# Patient Record
Sex: Female | Born: 1950 | Race: Black or African American | Hispanic: No | Smoking: Never smoker
Health system: Southern US, Community
[De-identification: ages and names within clinical notes are randomized; demographics above are authoritative.]

## PROBLEM LIST (undated history)

## (undated) DIAGNOSIS — E785 Hyperlipidemia, unspecified: Secondary | ICD-10-CM

## (undated) DIAGNOSIS — I10 Essential (primary) hypertension: Secondary | ICD-10-CM

## (undated) DIAGNOSIS — E669 Obesity, unspecified: Secondary | ICD-10-CM

## (undated) DIAGNOSIS — M1990 Unspecified osteoarthritis, unspecified site: Secondary | ICD-10-CM

## (undated) HISTORY — PX: ABDOMINAL HYSTERECTOMY: SHX81

---

## 2003-06-22 ENCOUNTER — Other Ambulatory Visit: Payer: Self-pay

## 2004-01-14 ENCOUNTER — Emergency Department (HOSPITAL_COMMUNITY): Admission: EM | Admit: 2004-01-14 | Discharge: 2004-01-14 | Payer: Self-pay | Admitting: Emergency Medicine

## 2010-10-21 ENCOUNTER — Ambulatory Visit: Payer: Self-pay | Admitting: Emergency Medicine

## 2012-02-08 ENCOUNTER — Emergency Department (HOSPITAL_COMMUNITY)
Admission: EM | Admit: 2012-02-08 | Discharge: 2012-02-08 | Disposition: A | Discharge status: Home / Self Care | Payer: Self-pay | Attending: Emergency Medicine | Admitting: Emergency Medicine

## 2012-02-08 ENCOUNTER — Encounter (HOSPITAL_COMMUNITY): Payer: Self-pay

## 2012-02-08 ENCOUNTER — Emergency Department (HOSPITAL_COMMUNITY): Payer: Self-pay

## 2012-02-08 DIAGNOSIS — R509 Fever, unspecified: Secondary | ICD-10-CM | POA: Insufficient documentation

## 2012-02-08 DIAGNOSIS — B9789 Other viral agents as the cause of diseases classified elsewhere: Secondary | ICD-10-CM | POA: Insufficient documentation

## 2012-02-08 DIAGNOSIS — R51 Headache: Secondary | ICD-10-CM | POA: Insufficient documentation

## 2012-02-08 DIAGNOSIS — B349 Viral infection, unspecified: Secondary | ICD-10-CM

## 2012-02-08 DIAGNOSIS — M2550 Pain in unspecified joint: Secondary | ICD-10-CM | POA: Insufficient documentation

## 2012-02-08 DIAGNOSIS — R000 Tachycardia, unspecified: Secondary | ICD-10-CM | POA: Insufficient documentation

## 2012-02-08 HISTORY — DX: Unspecified osteoarthritis, unspecified site: M19.90

## 2012-02-08 HISTORY — DX: Essential (primary) hypertension: I10

## 2012-02-08 LAB — CBC WITH DIFFERENTIAL/PLATELET
Basophils Absolute: 0 10*3/uL (ref 0.0–0.1)
Basophils Relative: 0 % (ref 0–1)
Hemoglobin: 11.6 g/dL — ABNORMAL LOW (ref 12.0–15.0)
Lymphs Abs: 2 10*3/uL (ref 0.7–4.0)
MCH: 30.3 pg (ref 26.0–34.0)
Monocytes Relative: 5 % (ref 3–12)
Platelets: 205 10*3/uL (ref 150–400)
RDW: 14.4 % (ref 11.5–15.5)
WBC: 10.2 10*3/uL (ref 4.0–10.5)

## 2012-02-08 LAB — URINALYSIS, ROUTINE W REFLEX MICROSCOPIC
Bilirubin Urine: NEGATIVE
Leukocytes, UA: NEGATIVE
Protein, ur: NEGATIVE mg/dL
Specific Gravity, Urine: 1.01 (ref 1.005–1.030)
pH: 6 (ref 5.0–8.0)

## 2012-02-08 LAB — BASIC METABOLIC PANEL
GFR calc non Af Amer: 64 mL/min — ABNORMAL LOW (ref >90)
Sodium: 134 mEq/L — ABNORMAL LOW (ref 135–145)

## 2012-02-08 LAB — URINE MICROSCOPIC-ADD ON

## 2012-02-08 MED ORDER — HYDROMORPHONE HCL PF 1 MG/ML IJ SOLN: 1.0000 mg | Freq: Once | INTRAMUSCULAR | Status: DC

## 2012-02-08 MED ORDER — ONDANSETRON HCL 4 MG/2ML IJ SOLN
4.0000 mg | Freq: Once | INTRAMUSCULAR | Status: AC
  Administered 2012-02-08: 4 mg via INTRAVENOUS
  Filled 2012-02-08: qty 2

## 2012-02-08 MED ORDER — ACETAMINOPHEN 500 MG PO TABS
1000.0000 mg | ORAL_TABLET | Freq: Once | ORAL | Status: AC
  Administered 2012-02-08: 1000 mg via ORAL

## 2012-02-08 MED ORDER — SODIUM CHLORIDE 0.9 % IV SOLN
Freq: Once | INTRAVENOUS | Status: AC
  Administered 2012-02-08: 09:00:00 via INTRAVENOUS

## 2012-02-08 MED ORDER — ACETAMINOPHEN 500 MG PO TABS
ORAL_TABLET | ORAL | Status: AC
  Administered 2012-02-08: 1000 mg via ORAL
  Filled 2012-02-08: qty 2

## 2012-02-08 MED ORDER — HYDROCODONE-ACETAMINOPHEN 7.5-325 MG PO TABS: 1.0000 | ORAL_TABLET | ORAL | Status: AC | PRN

## 2012-02-08 MED ORDER — HYDROMORPHONE HCL PF 1 MG/ML IJ SOLN
0.5000 mg | Freq: Once | INTRAMUSCULAR | Status: AC
  Administered 2012-02-08: 0.5 mg via INTRAVENOUS
  Filled 2012-02-08: qty 1

## 2012-02-08 NOTE — ED Provider Notes (Signed)
History     CSN: [id number]  Arrival date & time 02/08/12  0730   First MD Initiated Contact with Patient 02/08/12 [id number]      Chief Complaint  Patient presents with  . Headache    (Consider location/radiation/quality/duration/timing/severity/associated sxs/prior treatment) HPI Comments: Patient states she is here in the emergency room because of an increasing headache. The patient describes the headache as a sharp pain that starts at the back of her neck and comes up the back of her scalp and into her left eye. The patient has been nauseated with this. She has not been able to rest most of the night because of this pain. She has not had any unusual weakness, vision changes, or change in her speech. Patient states she has been out of her blood pressure medicine for" a while" primarily because she at times cannot afford them. She denies any recent injury to the head. The been no history of operations involving the brain. Patient felt that she can no longer take the pain and came to the emergency department this morning for assistance with this problem.  Patient is a 61 y.o. female presenting with headaches. The history is provided by the patient.  Headache  Pertinent negatives include no palpitations and no shortness of breath.    Past Medical History  Diagnosis Date  . Hypertension   . Arthritis     History reviewed. No pertinent past surgical history.  No family history on file.  History  Substance Use Topics  . Smoking status: Never Smoker   . Smokeless tobacco: Not on file  . Alcohol Use: Yes    OB History    Grav Para Term Preterm Abortions TAB SAB Ect Mult Living                  Review of Systems  Constitutional: Negative for activity change.       All ROS Neg except as noted in HPI  HENT: Negative for nosebleeds and neck pain.   Eyes: Negative for photophobia and discharge.  Respiratory: Negative for cough, shortness of breath and wheezing.   Cardiovascular:  Negative for chest pain and palpitations.  Gastrointestinal: Negative for abdominal pain and blood in stool.  Genitourinary: Negative for dysuria, frequency and hematuria.  Musculoskeletal: Positive for arthralgias. Negative for back pain.  Skin: Negative.   Neurological: Positive for headaches. Negative for dizziness, seizures and speech difficulty.  Psychiatric/Behavioral: Negative for hallucinations and confusion.    Allergies  Review of patient's allergies indicates no known allergies.  Home Medications  No current outpatient prescriptions on file.  BP 151/90  Pulse 109  Temp 102.3 F (39.1 C) (Rectal)  Resp 20  Ht 5\' 4"  (1.626 m)  Wt 242 lb (109.77 kg)  BMI 41.54 kg/m2  SpO2 96%  Physical Exam  Nursing note and vitals reviewed. Constitutional: She is oriented to person, place, and time. She appears well-developed and well-nourished.  Non-toxic appearance.  HENT:  Head: Normocephalic.  Right Ear: Tympanic membrane and external ear normal.  Left Ear: Tympanic membrane and external ear normal.  Eyes: EOM and lids are normal. Pupils are equal, round, and reactive to light.  Neck: Normal range of motion. Neck supple. Carotid bruit is not present.       Decrease ROM due to pain. No carotid bruit. No CLA.  Cardiovascular: Regular rhythm, normal heart sounds, intact distal pulses and normal pulses.  Tachycardia present.   Pulmonary/Chest: Breath sounds normal. No respiratory distress.  Abdominal:  Soft. Bowel sounds are normal. There is no tenderness. There is no guarding.  Musculoskeletal: Normal range of motion.  Lymphadenopathy:       Head (right side): No submandibular adenopathy present.       Head (left side): No submandibular adenopathy present.    She has no cervical adenopathy.  Neurological: She is alert and oriented to person, place, and time. She has normal strength. No cranial nerve deficit or sensory deficit. She exhibits normal muscle tone. Coordination normal.    Skin: Skin is warm and dry.  Psychiatric: She has a normal mood and affect. Her speech is normal.    ED Course  Procedures (including critical care time)  Labs Reviewed  CBC WITH DIFFERENTIAL - Abnormal; Notable for the following:    RBC 3.83 (*)     Hemoglobin 11.6 (*)     HCT 35.3 (*)     All other components within normal limits  BASIC METABOLIC PANEL - Abnormal; Notable for the following:    Sodium 134 (*)     Glucose, Bld 110 (*)     GFR calc non Af Amer 64 (*)     GFR calc Af Amer 74 (*)     All other components within normal limits  URINALYSIS, ROUTINE W REFLEX MICROSCOPIC - Abnormal; Notable for the following:    Hgb urine dipstick SMALL (*)     All other components within normal limits  URINE MICROSCOPIC-ADD ON   No results found.   No diagnosis found.    MDM  I have reviewed nursing notes, vital signs, and all appropriate lab and imaging results for this patient. Temperature went up to 102, but responded well to exit strength Tylenol.the chemistries revealed a sodium be slightly low 134 and a glucose to be slightly elevated at 110. The complete blood count was nonacute. Urinalysis was negative for urinary tract infection or other major pathology.CT scan showed no intracranial hemorrhages and no intracranial abnormalities.  Patient seen with me by Dr. Judd Lien. Patient feeling much better after Tylenol and IV pain medicine. At discharge patient has good range of motion of the neck without problem or complication states that she feels much better. Feel that it is safe for the patient to be discharged home at this time. Patient encouraged to return immediately if any changes, problems, or concerns.patient will use extra strength Tylenol every 4 hours for fever. Increase fluids. Prescription for Norco 7.5 given to the patient #20.       Kathie Dike, [place] 02/08/12 1148

## 2012-02-08 NOTE — ED Notes (Signed)
Complain of headache that started last night. Denies other symptoms. States she has been out of her BP meds for a while because she cannot afford them

## 2012-02-08 NOTE — ED Notes (Signed)
Pt states she feels a lot better  

## 2012-02-08 NOTE — Discharge Instructions (Signed)
Headache Headaches are caused by many different problems. Most commonly, headache is caused by muscle tension from an injury, fatigue, or emotional upset. Excessive muscle contractions in the scalp and neck result in a headache that often feels like a tight band around the head. Tension headaches often have areas of tenderness over the scalp and the back of the neck. These headaches may last for hours, days, or longer, and some may contribute to migraines in those who have migraine problems. Migraines usually cause a throbbing headache, which is made worse by activity. Sometimes only one side of the head hurts. Nausea, vomiting, eye pain, and avoidance of food are common with migraines. Visual symptoms such as light sensitivity, blind spots, or flashing lights may also occur. Loud noises may worsen migraine headaches. Many factors may cause migraine headaches:  Emotional stress, lack of sleep, and menstrual periods.   Alcohol and some drugs (such as birth control pills).   Diet factors (fasting, caffeine, food preservatives, chocolate).   Environmental factors (weather changes, bright lights, odors, smoke).  Other causes of headaches include minor injuries to the head. Arthritis in the neck; problems with the jaw, eyes, ears, or nose are also causes of headaches. Allergies, drugs, alcohol, and exposure to smoke can also cause moderate headaches. Rebound headaches can occur if someone uses pain medications for a long period of time and then stops. Less commonly, blood vessel problems in the neck and brain (including stroke) can cause various types of headache. Treatment of headaches includes medicines for pain and relaxation. Ice packs or heat applied to the back of the head and neck help some people. Massaging the shoulders, neck and scalp are often very useful. Relaxation techniques and stretching can help prevent these headaches. Avoid alcohol and cigarette smoking as these tend to make headaches  worse. Please see your caregiver if your headache is not better in 2 days.  SEEK IMMEDIATE MEDICAL CARE IF:   You develop a high fever, chills, or repeated vomiting.   You faint or have difficulty with vision.   You develop unusual numbness or weakness of your arms or legs.   Relief of pain is inadequate with medication, or you develop severe pain.   You develop confusion, or neck stiffness.   You have a worsening of a headache or do not obtain relief.  Document Released: 07/27/2005 Document Revised: 07/16/2011 Document Reviewed: 01/20/2007 [facility] Patient Information 2012 [place], [place].Viral Infections A virus is a type of germ. Viruses can cause:  Minor sore throats.   Aches and pains.   Headaches.   Runny nose.   Rashes.   Watery eyes.   Tiredness.   Coughs.   Loss of appetite.   Feeling sick to your stomach (nausea).   Throwing up (vomiting).   Watery poop (diarrhea).  HOME CARE   Only take medicines as told by your doctor.   Drink enough water and fluids to keep your pee (urine) clear or pale yellow. Sports drinks are a good choice.   Get plenty of rest and eat healthy. Soups and broths with crackers or rice are fine.  GET HELP RIGHT AWAY IF:   You have a very bad headache.   You have shortness of breath.   You have chest pain or neck pain.   You have an unusual rash.   You cannot stop throwing up.   You have watery poop that does not stop.   You cannot keep fluids down.   You or your child has  a temperature by mouth above 102 F (38.9 C), not controlled by medicine.   Your baby is older than 3 months with a rectal temperature of 102 F (38.9 C) or higher.   Your baby is 91 months old or younger with a rectal temperature of 100.4 F (38 C) or higher.  MAKE SURE YOU:   Understand these instructions.   Will watch this condition.   Will get help right away if you are not doing well or get worse.  Document Released: 07/09/2008 Document  Revised: 07/16/2011 Document Reviewed: 12/02/2010 [facility] Patient Information 2012 [place], [place].

## 2012-02-09 NOTE — ED Provider Notes (Signed)
Medical screening examination/treatment/procedure(s) were conducted as a shared visit with non-physician practitioner(s) and myself.  I personally evaluated the patient during the encounter.  I saw and examined the patient along with Loney Laurence and agree with his note, assessment, and plan.  The patient presents with headache for the past several days.  She had a fever today but denies any other symptom, such as cough, dysuria, etc.  On exam, vitals are stable and the patient was initially afebrile.  Shortly after arrival to the ED, it was rechecked and was elevated.  The heart and lung exam was unremarkable and the abdomen is benign.  Neurologically, the patient is intact without papilledema.  The workup was unremarkable and the patient is feeling much better after fluids and medications.  When I saw her, she was not meningitic or toxic-appearing.  There was no nuchal rigidity and she had free range of motion of her neck.  She will be discharged to home, to return prn if she worsens.    Geoffery Lyons, MD 02/09/12 [phone]

## 2014-12-12 ENCOUNTER — Emergency Department (HOSPITAL_COMMUNITY)
Admission: EM | Admit: 2014-12-12 | Discharge: 2014-12-12 | Disposition: A | Discharge status: Home / Self Care | Payer: Medicare Other | Attending: Emergency Medicine | Admitting: Emergency Medicine

## 2014-12-12 ENCOUNTER — Encounter (HOSPITAL_COMMUNITY): Payer: Self-pay | Admitting: Emergency Medicine

## 2014-12-12 DIAGNOSIS — Z79899 Other long term (current) drug therapy: Secondary | ICD-10-CM | POA: Insufficient documentation

## 2014-12-12 DIAGNOSIS — M1990 Unspecified osteoarthritis, unspecified site: Secondary | ICD-10-CM | POA: Diagnosis not present

## 2014-12-12 DIAGNOSIS — Y998 Other external cause status: Secondary | ICD-10-CM | POA: Diagnosis not present

## 2014-12-12 DIAGNOSIS — Z791 Long term (current) use of non-steroidal anti-inflammatories (NSAID): Secondary | ICD-10-CM | POA: Diagnosis not present

## 2014-12-12 DIAGNOSIS — Y9389 Activity, other specified: Secondary | ICD-10-CM | POA: Diagnosis not present

## 2014-12-12 DIAGNOSIS — H109 Unspecified conjunctivitis: Secondary | ICD-10-CM | POA: Diagnosis not present

## 2014-12-12 DIAGNOSIS — Z7982 Long term (current) use of aspirin: Secondary | ICD-10-CM | POA: Diagnosis not present

## 2014-12-12 DIAGNOSIS — Y9289 Other specified places as the place of occurrence of the external cause: Secondary | ICD-10-CM | POA: Insufficient documentation

## 2014-12-12 DIAGNOSIS — S0502XA Injury of conjunctiva and corneal abrasion without foreign body, left eye, initial encounter: Secondary | ICD-10-CM | POA: Diagnosis not present

## 2014-12-12 DIAGNOSIS — T1502XA Foreign body in cornea, left eye, initial encounter: Secondary | ICD-10-CM | POA: Diagnosis present

## 2014-12-12 DIAGNOSIS — X58XXXA Exposure to other specified factors, initial encounter: Secondary | ICD-10-CM | POA: Diagnosis not present

## 2014-12-12 DIAGNOSIS — I10 Essential (primary) hypertension: Secondary | ICD-10-CM | POA: Diagnosis not present

## 2014-12-12 MED ORDER — TOBRAMYCIN 0.3 % OP SOLN
2.0000 [drp] | Freq: Once | OPHTHALMIC | Status: AC
  Administered 2014-12-12: 2 [drp] via OPHTHALMIC
  Filled 2014-12-12: qty 5

## 2014-12-12 MED ORDER — FLUORESCEIN SODIUM 1 MG OP STRP
ORAL_STRIP | OPHTHALMIC | Status: AC
  Administered 2014-12-12: 21:00:00 via OPHTHALMIC
  Filled 2014-12-12: qty 1

## 2014-12-12 MED ORDER — TETRACAINE HCL 0.5 % OP SOLN
1.0000 [drp] | Freq: Once | OPHTHALMIC | Status: AC
  Administered 2014-12-12: 1 [drp] via OPHTHALMIC

## 2014-12-12 MED ORDER — TETRACAINE HCL 0.5 % OP SOLN
OPHTHALMIC | Status: AC
  Administered 2014-12-12: 1 [drp] via OPHTHALMIC
  Filled 2014-12-12: qty 2

## 2014-12-12 NOTE — ED Provider Notes (Signed)
CSN: [id number]     Arrival date & time 12/12/14  1951 History   First MD Initiated Contact with Patient 12/12/14 2022     Chief Complaint  Patient presents with  . Foreign Body in [place]     (Consider location/radiation/quality/duration/timing/severity/associated sxs/prior Treatment) Patient is a 64 y.o. female presenting with foreign body in eye. The history is provided by the patient.  Foreign Body in Eye This is a new problem. The current episode started in the past 7 days. The problem occurs constantly. The problem has been unchanged. Associated symptoms include arthralgias. Pertinent negatives include no abdominal pain, chest pain, coughing or neck pain. Associated symptoms comments: photophobia. Nothing aggravates the symptoms. Treatments tried: flushing eyes. The treatment provided no relief.    Past Medical History  Diagnosis Date  . Hypertension   . Arthritis    No past surgical history on file. No family history on file. History  Substance Use Topics  . Smoking status: Never Smoker   . Smokeless tobacco: Not on file  . Alcohol Use: No   OB History    No data available     Review of Systems  Constitutional: Negative for activity change.       All ROS Neg except as noted in HPI  HENT: Negative for nosebleeds.   Eyes: Positive for photophobia, pain and redness. Negative for discharge.  Respiratory: Negative for cough, shortness of breath and wheezing.   Cardiovascular: Negative for chest pain and palpitations.  Gastrointestinal: Negative for abdominal pain and blood in stool.  Genitourinary: Negative for dysuria, frequency and hematuria.  Musculoskeletal: Positive for arthralgias. Negative for back pain and neck pain.  Skin: Negative.   Neurological: Negative for dizziness, seizures and speech difficulty.  Psychiatric/Behavioral: Negative for hallucinations and confusion.      Allergies  Review of patient's allergies indicates no known allergies.  Home  Medications   Prior to Admission medications   Medication Sig Start Date End Date Taking? Authorizing Provider  albuterol (PROVENTIL HFA;VENTOLIN HFA) 108 (90 BASE) MCG/ACT inhaler Inhale 2 puffs into the lungs every 4 (four) hours as needed. FOR SHORTNESS OF BREATH    Historical Provider, MD  aspirin 325 MG tablet Take 325 mg by mouth every morning.    Historical Provider, MD  atenolol (TENORMIN) 25 MG tablet Take 25 mg by mouth every morning.    Historical Provider, MD  hydrochlorothiazide (HYDRODIURIL) 25 MG tablet Take 25 mg by mouth every morning.    Historical Provider, MD  meloxicam (MOBIC) 15 MG tablet Take 15 mg by mouth every morning.    Historical Provider, MD  Menthol-Methyl Salicylate (BEN GAY GREASELESS) 10-15 % greaseless cream Apply 1 application topically 3 (three) times daily.    Historical Provider, MD   BP 198/81 mmHg  Pulse 91  Temp(Src) 98.2 F (36.8 C)  Resp 20  Ht 5\' 5"  (1.651 m)  Wt 250 lb (113.399 kg)  BMI 41.60 kg/m2  SpO2 99% Physical Exam  Constitutional: She is oriented to person, place, and time. She appears well-developed and well-nourished.  Non-toxic appearance.  HENT:  Head: Normocephalic.  Right Ear: Tympanic membrane and external ear normal.  Left Ear: Tympanic membrane and external ear normal.  Eyes: EOM and lids are normal. Pupils are equal, round, and reactive to light. Right eye exhibits no chemosis, no discharge and no hordeolum. No foreign body present in the right eye. Left eye exhibits no chemosis, no discharge and no hordeolum. No foreign body present in the  left eye. Right conjunctiva is injected. Left conjunctiva is injected.  Slit lamp exam:      The right eye shows no foreign body and no hyphema.       The left eye shows corneal abrasion and fluorescein uptake. The left eye shows no foreign body and no hyphema.    Neck: Normal range of motion. Neck supple. Carotid bruit is not present.  Cardiovascular: Normal rate, regular rhythm,  normal heart sounds, intact distal pulses and normal pulses.   Pulmonary/Chest: Breath sounds normal. No respiratory distress.  Abdominal: Soft. Bowel sounds are normal. There is no tenderness. There is no guarding.  Musculoskeletal: Normal range of motion.  Lymphadenopathy:       Head (right side): No submandibular adenopathy present.       Head (left side): No submandibular adenopathy present.    She has no cervical adenopathy.  Neurological: She is alert and oriented to person, place, and time. She has normal strength. No cranial nerve deficit or sensory deficit.  Skin: Skin is warm and dry.  Psychiatric: She has a normal mood and affect. Her speech is normal.  Nursing note and vitals reviewed.   ED Course  Procedures (including critical care time) Labs Review Labs Reviewed - No data to display  Imaging Review No results found.   EKG Interpretation None      MDM No foreign body noted of the right or left eye. The conjunctiva is injected on the right and the left. There is a small shallow abrasion of the left cornea. The plan at this time is for the patient be treated with tobramycin ophthalmic drops to both eyes, the patient will be asked to use cool compresses 3-4 times daily until this improves. She will see her eye specialist, or return to the emergency department if not improving.    Final diagnoses:  None    *I have reviewed nursing notes, vital signs, and all appropriate lab and imaging results for this patient.**    Lily Kocher, PA-C 12/12/14 2044  Virgel Manifold, MD 12/15/14 [phone]

## 2014-12-12 NOTE — Discharge Instructions (Signed)
Please use 2 drops of tobramycin to the right and left every 4 hours for the next 5 days. Please use dark glasses. Please wash hands frequently. Please see your eye specialist if not improving. Corneal Abrasion The cornea is the clear covering at the front and center of the eye. When you look at the colored portion of the eye, you are looking through the cornea. It is a thin tissue made up of layers. The top layer is the most sensitive layer. A corneal abrasion happens if this layer is scratched or an injury causes it to come off.  HOME CARE  You may be given drops or a medicated cream. Use the medicine as told by your doctor.  A pressure patch may be put over the eye. If this is done, follow your doctor's instructions for when to remove the patch. Do not drive or use machines while the eye patch is on. Judging distances is hard to do with a patch on.  See your doctor for a follow-up exam if you are told to do so. It is very important that you keep this appointment. GET HELP IF:   You have pain, are sensitive to light, and have a scratchy feeling in one eye or both eyes.  Your pressure patch keeps getting loose. You can blink your eye under the patch.  You have fluid coming from your eye or the lids stick together in the morning.  You have the same symptoms in the morning that you did with the first abrasion. This could be days, weeks, or months after the first abrasion healed. MAKE SURE YOU:   Understand these instructions.  Will watch your condition.  Will get help right away if you are not doing well or get worse. Document Released: 01/13/2008 Document Revised: 05/17/2013 Document Reviewed: 04/03/2013 [facility] Patient Information 2015 [place], [place]. This information is not intended to replace advice given to you by your health care provider. Make sure you discuss any questions you have with your health care provider.

## 2014-12-12 NOTE — ED Notes (Signed)
Patient states she was cleaning fish on Monday and thinks she has something in both eyes.

## 2015-12-03 ENCOUNTER — Emergency Department (HOSPITAL_COMMUNITY): Payer: Medicare Other

## 2015-12-03 ENCOUNTER — Emergency Department (HOSPITAL_COMMUNITY)
Admission: EM | Admit: 2015-12-03 | Discharge: 2015-12-03 | Disposition: A | Discharge status: Home / Self Care | Payer: Medicare Other | Attending: Emergency Medicine | Admitting: Emergency Medicine

## 2015-12-03 ENCOUNTER — Encounter (HOSPITAL_COMMUNITY): Payer: Self-pay | Admitting: Emergency Medicine

## 2015-12-03 DIAGNOSIS — I10 Essential (primary) hypertension: Secondary | ICD-10-CM

## 2015-12-03 DIAGNOSIS — M1711 Unilateral primary osteoarthritis, right knee: Secondary | ICD-10-CM | POA: Diagnosis not present

## 2015-12-03 DIAGNOSIS — M25561 Pain in right knee: Secondary | ICD-10-CM

## 2015-12-03 DIAGNOSIS — Z79899 Other long term (current) drug therapy: Secondary | ICD-10-CM | POA: Insufficient documentation

## 2015-12-03 DIAGNOSIS — Z7982 Long term (current) use of aspirin: Secondary | ICD-10-CM | POA: Insufficient documentation

## 2015-12-03 DIAGNOSIS — G8929 Other chronic pain: Secondary | ICD-10-CM | POA: Insufficient documentation

## 2015-12-03 MED ORDER — HYDROCODONE-ACETAMINOPHEN 5-325 MG PO TABS
2.0000 | ORAL_TABLET | Freq: Once | ORAL | Status: AC
  Administered 2015-12-03: 2 via ORAL
  Filled 2015-12-03: qty 2

## 2015-12-03 MED ORDER — HYDROCODONE-ACETAMINOPHEN 5-325 MG PO TABS: 1.0000 | ORAL_TABLET | ORAL | Status: DC | PRN

## 2015-12-03 MED ORDER — IBUPROFEN 400 MG PO TABS
400.0000 mg | ORAL_TABLET | Freq: Once | ORAL | Status: AC
  Administered 2015-12-03: 400 mg via ORAL
  Filled 2015-12-03: qty 1

## 2015-12-03 NOTE — Discharge Instructions (Signed)
Take 400 mg ibuprofen up to twice daily for pain. For severe pain take norco or vicodin however realize they have the potential for addiction and it can make you sleepy and has tylenol in it.  No operating machinery while taking. If you were given medicines take as directed.  If you are on coumadin or contraceptives realize their levels and effectiveness is altered by many different medicines.  If you have any reaction (rash, tongues swelling, other) to the medicines stop taking and see a physician.    If your blood pressure was elevated in the ER make sure you follow up for management with a primary doctor or return for chest pain, shortness of breath or stroke symptoms.  Please follow up as directed and return to the ER or see a physician for new or worsening symptoms.  Thank you. Filed Vitals:   12/03/15 0905  BP: 188/91  Pulse: 66  Temp: 97.9 F (36.6 C)  TempSrc: Oral  Resp: 20  Height: 5\' 3"  (1.6 m)  Weight: 250 lb (113.399 kg)  SpO2: 97%
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
## 2015-12-04 ENCOUNTER — Telehealth: Payer: Self-pay | Admitting: Orthopedic Surgery

## 2015-12-04 NOTE — Telephone Encounter (Signed)
Patient called following [facility] visit at [facility] 12/03/15 for problem of knee pain; per insurance (secondary) referral from primary care required; therefore, awaiting referral to schedule appointment.  Patient contacting her doctor's office to request referral.

## 2015-12-17 ENCOUNTER — Ambulatory Visit (INDEPENDENT_AMBULATORY_CARE_PROVIDER_SITE_OTHER): Payer: Medicare Other | Admitting: Orthopedic Surgery

## 2015-12-17 VITALS — BP 205/115 | HR 64 | Ht 63.0 in | Wt 249.0 lb

## 2015-12-17 DIAGNOSIS — M129 Arthropathy, unspecified: Secondary | ICD-10-CM | POA: Diagnosis not present

## 2015-12-17 DIAGNOSIS — M1711 Unilateral primary osteoarthritis, right knee: Secondary | ICD-10-CM
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
## 2015-12-17 NOTE — Patient Instructions (Signed)
Take aleve every morning Ice 30 minutes every afternoon Joint Injection  Care After  Refer to this sheet in the next few days. These instructions provide you with information on caring for yourself after you have had a joint injection. Your caregiver also may give you more specific instructions. Your treatment has been planned according to current medical practices, but problems sometimes occur. Call your caregiver if you have any problems or questions after your procedure.  After any type of joint injection, it is not uncommon to experience:  Soreness, swelling, or bruising around the injection site.  Mild numbness, tingling, or weakness around the injection site caused by the numbing medicine used before or with the injection. It also is possible to experience the following effects associated with the specific agent after injection:  Iodine-based contrast agents:  Allergic reaction (itching, hives, widespread redness, and swelling beyond the injection site).  Corticosteroids (These effects are rare.):  Allergic reaction.  Increased blood sugar levels (If you have diabetes and you notice that your blood sugar levels have increased, notify your caregiver).  Increased blood pressure levels.  Mood swings.  Hyaluronic acid in the use of viscosupplementation.  Temporary heat or redness.  Temporary rash and itching.  Increased fluid accumulation in the injected joint. These effects all should resolve within a day after your procedure.  HOME CARE INSTRUCTIONS  Limit yourself to light activity the day of your procedure. Avoid lifting heavy objects, bending, stooping, or twisting.  Take prescription or over-the-counter pain medication as directed by your caregiver.  You may apply ice to your injection site to reduce pain and swelling the day of your procedure. Ice may be applied 3-4 times:  Put ice in a plastic bag.  Place a towel between your skin and the bag.  Leave the ice on for no longer than  15-20 minutes each time. SEEK IMMEDIATE MEDICAL CARE IF:  Pain and swelling get worse rather than better or extend beyond the injection site.  Numbness does not go away.  Blood or fluid continues to leak from the injection site.  You have chest pain.  You have swelling of your face or tongue.  You have trouble breathing or you become dizzy.  You develop a fever, chills, or severe tenderness at the injection site that last longer than 1 day. MAKE SURE YOU:  Understand these instructions.  Watch your condition.  Get help right away if you are not doing well or if you get worse. Document Released: 04/09/2011 Document Revised: 10/19/2011 Document Reviewed: 04/09/2011  [facility] Patient Information 2014 [place].

## 2016-01-14 ENCOUNTER — Ambulatory Visit (INDEPENDENT_AMBULATORY_CARE_PROVIDER_SITE_OTHER): Payer: Medicare Other | Admitting: Orthopedic Surgery

## 2016-01-14 VITALS — BP 169/87 | HR 68 | Ht 63.0 in | Wt 243.4 lb

## 2016-01-14 DIAGNOSIS — M1711 Unilateral primary osteoarthritis, right knee: Secondary | ICD-10-CM

## 2016-01-14 DIAGNOSIS — M129 Arthropathy, unspecified: Secondary | ICD-10-CM

## 2016-01-14 NOTE — Progress Notes (Signed)
Patient ID: Gabrielle Lowe, female   DOB: 03/28/1951, 65 y.o.   MRN: [id number]  Chief Complaint  Patient presents with  . Follow-up    Right knee    HPI  Gabrielle Lowe is a 65 year old female with obesity and osteoarthritis of the right knee who had an injection comes in for reevaluation with fairly significant improvement in her overall pain.  ROS she has no mechanical symptoms at this time her knee moves better she says and she is able to do more with less pain  Past Medical History  Diagnosis Date  . Hypertension   . Arthritis     BP 169/87 mmHg  Pulse 68  Ht 5\' 3"  (1.6 m)  Wt 243 lb 6.4 oz (110.406 kg)  BMI 43.13 kg/m2  Physical Exam Physical Exam  Constitutional: The patient appears well-developed and well-nourished. No distress.  The patient is oriented to person, place, and time.  Psychiatric: The patient has a normal mood and affect.  Cardiovascular: Intact distal pulses.   Neurological: sensation is normal  Skin: Skin is warm and dry. No rash noted. The patient is not diaphoretic. No erythema. No pallor.    Ortho Exam   She walks without any disturbance in her gait pattern. She has medial joint line tenderness. No effusion. Knee flexion is 90-100 her knee remains stable. The skin shows discoloration from the upper chloride we think      ASSESSMENT AND PLAN   Improved but still persistent pain in her right knee from arthritis  Patient agreed to injection in the right knee  Procedure note right knee injection verbal consent was obtained to inject right knee joint  Timeout was completed to confirm the site of injection  The medications used were 40 mg of Depo-Medrol and 1% lidocaine 3 cc  Anesthesia was provided by ethyl chloride and the skin was prepped with alcohol.  After cleaning the skin with alcohol a 20-gauge needle was used to inject the right knee joint. There were no complications. A sterile bandage was applied.   Gabrielle Abbott,  MD 01/14/2016 12:13 PM

## 2017-03-12 ENCOUNTER — Encounter: Payer: Self-pay | Admitting: *Deleted

## 2017-03-15 ENCOUNTER — Ambulatory Visit
Admission: RE | Admit: 2017-03-15 | Discharge: 2017-03-15 | Disposition: A | Discharge status: Home / Self Care | Payer: Medicare Other | Source: Ambulatory Visit | Attending: Unknown Physician Specialty | Admitting: Unknown Physician Specialty

## 2017-03-15 ENCOUNTER — Ambulatory Visit: Payer: Medicare Other | Admitting: Anesthesiology

## 2017-03-15 ENCOUNTER — Encounter
Admission: RE | Disposition: A | Discharge status: Home / Self Care | Payer: Self-pay | Source: Ambulatory Visit | Attending: Unknown Physician Specialty

## 2017-03-15 ENCOUNTER — Encounter: Payer: Self-pay | Admitting: *Deleted

## 2017-03-15 DIAGNOSIS — E785 Hyperlipidemia, unspecified: Secondary | ICD-10-CM | POA: Insufficient documentation

## 2017-03-15 DIAGNOSIS — Z6841 Body Mass Index (BMI) 40.0 and over, adult: Secondary | ICD-10-CM | POA: Diagnosis not present

## 2017-03-15 DIAGNOSIS — D125 Benign neoplasm of sigmoid colon: Secondary | ICD-10-CM | POA: Diagnosis not present

## 2017-03-15 DIAGNOSIS — Z79899 Other long term (current) drug therapy: Secondary | ICD-10-CM | POA: Insufficient documentation

## 2017-03-15 DIAGNOSIS — K640 First degree hemorrhoids: Secondary | ICD-10-CM | POA: Insufficient documentation

## 2017-03-15 DIAGNOSIS — I10 Essential (primary) hypertension: Secondary | ICD-10-CM | POA: Insufficient documentation

## 2017-03-15 DIAGNOSIS — Z7982 Long term (current) use of aspirin: Secondary | ICD-10-CM | POA: Diagnosis not present

## 2017-03-15 DIAGNOSIS — K5730 Diverticulosis of large intestine without perforation or abscess without bleeding: Secondary | ICD-10-CM | POA: Diagnosis not present

## 2017-03-15 DIAGNOSIS — Z1211 Encounter for screening for malignant neoplasm of colon: Secondary | ICD-10-CM | POA: Insufficient documentation

## 2017-03-15 HISTORY — DX: Obesity, unspecified: E66.9

## 2017-03-15 HISTORY — PX: COLONOSCOPY WITH PROPOFOL: SHX5780

## 2017-03-15 HISTORY — DX: Hyperlipidemia, unspecified: E78.5

## 2017-03-15 SURGERY — COLONOSCOPY WITH PROPOFOL
Anesthesia: General

## 2017-03-15 MED ORDER — PROPOFOL 500 MG/50ML IV EMUL
INTRAVENOUS | Status: AC
  Filled 2017-03-15: qty 50

## 2017-03-15 MED ORDER — PROPOFOL 500 MG/50ML IV EMUL
INTRAVENOUS | Status: DC | PRN
  Administered 2017-03-15: 125 ug/kg/min via INTRAVENOUS

## 2017-03-15 MED ORDER — LIDOCAINE HCL (CARDIAC) 20 MG/ML IV SOLN
INTRAVENOUS | Status: DC | PRN
  Administered 2017-03-15: 50 mg via INTRAVENOUS

## 2017-03-15 MED ORDER — SODIUM CHLORIDE 0.9 % IV SOLN: INTRAVENOUS | Status: DC

## 2017-03-15 MED ORDER — SODIUM CHLORIDE 0.9 % IV SOLN
INTRAVENOUS | Status: DC
  Administered 2017-03-15: 13:00:00 via INTRAVENOUS

## 2017-03-15 MED ORDER — PROPOFOL 10 MG/ML IV BOLUS
INTRAVENOUS | Status: DC | PRN
  Administered 2017-03-15: 50 mg via INTRAVENOUS

## 2017-03-15 NOTE — Anesthesia Procedure Notes (Signed)
Performed by: Lance Muss Pre-anesthesia Checklist: Patient identified, Emergency Drugs available, Suction available, Patient being monitored and Timeout performed Patient Re-evaluated:Patient Re-evaluated prior to induction Oxygen Delivery Method: Nasal cannula Preoxygenation: Pre-oxygenation with 100% oxygen Induction Type: IV induction Ventilation: Nasal airway inserted- appropriate to patient size

## 2017-03-15 NOTE — H&P (Signed)
   Primary Care Physician:  Claggett, Neita Goodnight, PA-C Primary Gastroenterologist:  Dr. Vira Agar  Pre-Procedure History & Physical: HPI:  Gabrielle Lowe is a 66 y.o. female is here for an colonoscopy.   Past Medical History:  Diagnosis Date  . Arthritis   . Hyperlipemia   . Hypertension   . Obesity     Past Surgical History:  Procedure Laterality Date  . ABDOMINAL HYSTERECTOMY      Prior to Admission medications   Medication Sig Start Date End Date Taking? Authorizing Provider  aspirin EC 81 MG tablet Take 81 mg by mouth daily.   Yes [provider]  atenolol (TENORMIN) 25 MG tablet Take 12.5 mg by mouth every morning.    Yes [provider]  hydrochlorothiazide (HYDRODIURIL) 25 MG tablet Take 25 mg by mouth every morning.   Yes [provider]  HYDROcodone-acetaminophen (NORCO) 5-325 MG tablet Take 1 tablet by mouth every 4 (four) hours as needed. 12/03/15  Yes Elnora Morrison, MD  albuterol (PROVENTIL HFA;VENTOLIN HFA) 108 (90 BASE) MCG/ACT inhaler Inhale 2 puffs into the lungs every 4 (four) hours as needed. FOR SHORTNESS OF BREATH    [provider]  omeprazole (PRILOSEC) 20 MG capsule Take 20 mg by mouth daily.    [provider]  ranitidine (ZANTAC) 150 MG tablet Take 150 mg by mouth 2 (two) times daily.    [provider]    Allergies as of 03/12/2017  . (No Known Allergies)    History reviewed. No pertinent family history.  Social History   Social History  . Marital status: Widowed    Spouse name: N/A  . Number of children: N/A  . Years of education: N/A   Occupational History  . Not on file.   Social History Main Topics  . Smoking status: Never Smoker  . Smokeless tobacco: Never Used  . Alcohol use No  . Drug use: No  . Sexual activity: Not on file   Other Topics Concern  . Not on file   Social History Narrative  . No narrative on file    Review of Systems: See HPI, otherwise negative ROS  Physical  Exam: BP (!) 153/87   Pulse 62   Temp (!) 96.3 F (35.7 C) (Tympanic)   Resp 14   Ht 5\' 4"  (1.626 m)   Wt 108.9 kg (240 lb)   SpO2 99%   BMI 41.20 kg/m  General:   Alert,  pleasant and cooperative in NAD Head:  Normocephalic and atraumatic. Neck:  Supple; no masses or thyromegaly. Lungs:  Clear throughout to auscultation.    Heart:  Regular rate and rhythm. Abdomen:  Soft, nontender and nondistended. Normal bowel sounds, without guarding, and without rebound.   Neurologic:  Alert and  oriented x4;  grossly normal neurologically.  Impression/Plan: Gabrielle Lowe is here for an colonoscopy to be performed for colonscreening  Risks, benefits, limitations, and alternatives regarding  colonoscopy have been reviewed with the patient.  Questions have been answered.  All parties agreeable.   Gaylyn Cheers, MD  03/15/2017, 1:46 PM

## 2017-03-15 NOTE — Transfer of Care (Signed)
Immediate Anesthesia Transfer of Care Note  Patient: Gabrielle Lowe  Procedure(s) Performed: Procedure(s): COLONOSCOPY WITH PROPOFOL (N/A)  Patient Location: PACU  Anesthesia Type:General  Level of Consciousness: sedated and responds to stimulation  Airway & Oxygen Therapy: Patient Spontanous Breathing and Patient connected to nasal cannula oxygen  Post-op Assessment: Report given to RN and Post -op Vital signs reviewed and stable  Post vital signs: Reviewed and stable  Last Vitals:  Vitals:   03/15/17 1249 03/15/17 1427  BP: (!) 153/87 (!) 106/53  Pulse: 62 64  Resp: 14 14  Temp: (!) 35.7 C     Last Pain:  Vitals:   03/15/17 1249  TempSrc: Tympanic         Complications: No apparent anesthesia complications

## 2017-03-15 NOTE — Anesthesia Preprocedure Evaluation (Signed)
Anesthesia Evaluation  Patient identified by MRN, date of birth, ID band Patient awake    Reviewed: Allergy & Precautions, NPO status , Patient's Chart, lab work & pertinent test results  Airway Mallampati: II       Dental  (+) Upper Dentures   Pulmonary neg pulmonary ROS,    breath sounds clear to auscultation       Cardiovascular Exercise Tolerance: Good hypertension, Pt. on home beta blockers  Rhythm:Regular     Neuro/Psych negative neurological ROS  negative psych ROS   GI/Hepatic negative GI ROS, Neg liver ROS,   Endo/Other  Morbid obesity  Renal/GU negative Renal ROS     Musculoskeletal negative musculoskeletal ROS (+)   Abdominal (+) + obese,   Peds negative pediatric ROS (+)  Hematology   Anesthesia Other Findings   Reproductive/Obstetrics                             Anesthesia Physical Anesthesia Plan  ASA: III  Anesthesia Plan: General   Post-op Pain Management:    Induction: Intravenous  PONV Risk Score and Plan: 0  Airway Management Planned: Natural Airway and Nasal Cannula  Additional Equipment:   Intra-op Plan:   Post-operative Plan:   Informed Consent: I have reviewed the patients History and Physical, chart, labs and discussed the procedure including the risks, benefits and alternatives for the proposed anesthesia with the patient or authorized representative who has indicated his/her understanding and acceptance.     Plan Discussed with: CRNA  Anesthesia Plan Comments:         Anesthesia Quick Evaluation

## 2017-03-15 NOTE — Anesthesia Post-op Follow-up Note (Cosign Needed)
Anesthesia QCDR form completed.        

## 2017-03-15 NOTE — Op Note (Signed)
[facility]astroenterology Patient Name: Gabrielle Lowe Procedure Date: 03/15/2017 1:42 PM MRN: [id number] Account #: [id number] Date of Birth: 15-Dec-1950 Admit Type: Outpatient Age: 66 Room: [facility] ENDO ROOM 3 Gender: Female Note Status: Finalized Procedure:            Colonoscopy Indications:          Screening for colorectal malignant neoplasm Providers:            Manya Silvas, MD Referring MD:         Chip Boer PA-C, PA (Referring MD) Medicines:            Propofol per Anesthesia Complications:        No immediate complications. Procedure:            Pre-Anesthesia Assessment:                       - After reviewing the risks and benefits, the patient                        was deemed in satisfactory condition to undergo the                        procedure.                       After obtaining informed consent, the colonoscope was                        passed under direct vision. Throughout the procedure,                        the patient's blood pressure, pulse, and oxygen                        saturations were monitored continuously. The                        Colonoscope was introduced through the anus and                        advanced to the the ileocecal valve. The colonoscopy                        was somewhat difficult due to the patient's body                        habitus. Successful completion of the procedure was                        aided by applying abdominal pressure. The patient                        tolerated the procedure well. The quality of the bowel                        preparation was good. Findings:      A small polyp was found in the sigmoid colon. The polyp was sessile. The       polyp was removed with a hot snare. Polyp resection was incomplete, and       the resected tissue was not  retrieved. Likely removed piece lost in       colon. A clip was applied to the site due to bleeding. After the partial   removal the site looked more like a swollen fold and not a true polyp.      Multiple small and large-mouthed diverticula were found in the sigmoid       colon and descending colon.      Internal hemorrhoids were found during endoscopy. The hemorrhoids were       small and Grade I (internal hemorrhoids that do not prolapse). Impression:           - One small polyp in the sigmoid colon, removed with a                        hot snare. Incomplete resection. Resected tissue not                        retrieved.                       - Diverticulosis in the sigmoid colon and in the                        descending colon.                       - Internal hemorrhoids. Recommendation:       Repeat in 5 years.                       - The findings and recommendations were discussed with                        the patient's family. Manya Silvas, MD 03/15/2017 2:30:13 PM This report has been signed electronically. Number of Addenda: 0 Note Initiated On: 03/15/2017 1:42 PM Scope Withdrawal Time: 0 hours 14 minutes 25 seconds  Total Procedure Duration: 0 hours 28 minutes 23 seconds       [facility]

## 2017-03-16 ENCOUNTER — Encounter: Payer: Self-pay | Admitting: Unknown Physician Specialty

## 2017-03-16 NOTE — Anesthesia Postprocedure Evaluation (Signed)
Anesthesia Post Note  Patient: Gabrielle Lowe  Procedure(s) Performed: Procedure(s) (LRB): COLONOSCOPY WITH PROPOFOL (N/A)  Patient location during evaluation: PACU Anesthesia Type: General Level of consciousness: awake Pain management: pain level controlled Vital Signs Assessment: post-procedure vital signs reviewed and stable Respiratory status: nonlabored ventilation Cardiovascular status: stable Anesthetic complications: no     Last Vitals:  Vitals:   03/15/17 1427 03/15/17 1436  BP: (!) 106/53 (!) 121/50  Pulse: 64 (!) 59  Resp: 14 17  Temp:      Last Pain:  Vitals:   03/15/17 1426  TempSrc: Tympanic                 VAN STAVEREN,Bryann Mcnealy

## 2018-04-19 ENCOUNTER — Encounter (HOSPITAL_COMMUNITY): Payer: Self-pay | Admitting: *Deleted

## 2018-04-19 ENCOUNTER — Other Ambulatory Visit: Payer: Self-pay

## 2018-04-19 ENCOUNTER — Emergency Department (HOSPITAL_COMMUNITY)
Admission: EM | Admit: 2018-04-19 | Discharge: 2018-04-19 | Disposition: A | Discharge status: Home / Self Care | Payer: Medicare HMO | Attending: Emergency Medicine | Admitting: Emergency Medicine

## 2018-04-19 ENCOUNTER — Emergency Department (HOSPITAL_COMMUNITY): Payer: Medicare HMO

## 2018-04-19 DIAGNOSIS — M19011 Primary osteoarthritis, right shoulder: Secondary | ICD-10-CM | POA: Insufficient documentation

## 2018-04-19 DIAGNOSIS — Y939 Activity, unspecified: Secondary | ICD-10-CM | POA: Diagnosis not present

## 2018-04-19 DIAGNOSIS — S4991XA Unspecified injury of right shoulder and upper arm, initial encounter: Secondary | ICD-10-CM | POA: Diagnosis present

## 2018-04-19 DIAGNOSIS — Z79899 Other long term (current) drug therapy: Secondary | ICD-10-CM | POA: Diagnosis not present

## 2018-04-19 DIAGNOSIS — Z7982 Long term (current) use of aspirin: Secondary | ICD-10-CM | POA: Insufficient documentation

## 2018-04-19 DIAGNOSIS — Y9222 Religious institution as the place of occurrence of the external cause: Secondary | ICD-10-CM | POA: Diagnosis not present

## 2018-04-19 DIAGNOSIS — Y999 Unspecified external cause status: Secondary | ICD-10-CM | POA: Diagnosis not present

## 2018-04-19 DIAGNOSIS — W010XXA Fall on same level from slipping, tripping and stumbling without subsequent striking against object, initial encounter: Secondary | ICD-10-CM | POA: Diagnosis not present

## 2018-04-19 DIAGNOSIS — I10 Essential (primary) hypertension: Secondary | ICD-10-CM | POA: Insufficient documentation

## 2018-04-19 DIAGNOSIS — S40011A Contusion of right shoulder, initial encounter: Secondary | ICD-10-CM | POA: Diagnosis not present

## 2018-04-19 MED ORDER — ACETAMINOPHEN 500 MG PO TABS
1000.0000 mg | ORAL_TABLET | Freq: Once | ORAL | Status: AC
  Administered 2018-04-19: 1000 mg via ORAL
  Filled 2018-04-19: qty 2

## 2018-04-19 MED ORDER — DEXAMETHASONE 4 MG PO TABS: 4.0000 mg | ORAL_TABLET | Freq: Two times a day (BID) | ORAL | 0 refills | Status: AC

## 2018-04-19 MED ORDER — ONDANSETRON HCL 4 MG PO TABS
4.0000 mg | ORAL_TABLET | Freq: Once | ORAL | Status: AC
  Administered 2018-04-19: 4 mg via ORAL
  Filled 2018-04-19: qty 1

## 2018-04-19 MED ORDER — TRAMADOL HCL 50 MG PO TABS: ORAL_TABLET | ORAL | 0 refills | Status: AC

## 2018-04-19 MED ORDER — KETOROLAC TROMETHAMINE 10 MG PO TABS
10.0000 mg | ORAL_TABLET | Freq: Once | ORAL | Status: AC
  Administered 2018-04-19: 10 mg via ORAL
  Filled 2018-04-19: qty 1

## 2018-04-19 MED ORDER — PREDNISONE 20 MG PO TABS
40.0000 mg | ORAL_TABLET | Freq: Once | ORAL | Status: AC
  Administered 2018-04-19: 40 mg via ORAL
  Filled 2018-04-19: qty 2

## 2018-04-19 NOTE — ED Provider Notes (Signed)
[facility] EMERGENCY DEPARTMENT Provider Note   CSN: [id number] Arrival date & time: 04/19/18  1725     History   Chief Complaint Chief Complaint  Patient presents with  . Fall    HPI Gabrielle Lowe is a 67 y.o. female.  Patient is a 67 year old female who presents to the emergency department with a complaint of right shoulder pain.  The patient states that about 2 to 3 weeks ago she fell at church.  She states that she only had her feet partially in her shoes, she stumbled, fell, and injured the right shoulder.  The patient states she has had some discomfort since that time, but she was able to manage all of her activities of daily living.  On yesterday the pain started getting worse.  Today she could not raise her arm up at a particular level, and she could not do the other activities that were necessary with her right arm.  The history is provided by the patient.  Fall  Pertinent negatives include no chest pain, no abdominal pain and no shortness of breath.    Past Medical History:  Diagnosis Date  . Arthritis   . Hyperlipemia   . Hypertension   . Obesity     There are no active problems to display for this patient.   Past Surgical History:  Procedure Laterality Date  . ABDOMINAL HYSTERECTOMY    . COLONOSCOPY WITH PROPOFOL N/A 03/15/2017   Procedure: COLONOSCOPY WITH PROPOFOL;  Surgeon: Manya Silvas, MD;  Location: [facility] ENDOSCOPY;  Service: Endoscopy;  Laterality: N/A;     OB History   None      Home Medications    Prior to Admission medications   Medication Sig Start Date End Date Taking? Authorizing Provider  albuterol (PROVENTIL HFA;VENTOLIN HFA) 108 (90 BASE) MCG/ACT inhaler Inhale 2 puffs into the lungs every 4 (four) hours as needed. FOR SHORTNESS OF BREATH    [provider]  aspirin EC 81 MG tablet Take 81 mg by mouth daily.    [provider]  atenolol (TENORMIN) 25 MG tablet Take 12.5 mg by mouth every morning.      [provider]  hydrochlorothiazide (HYDRODIURIL) 25 MG tablet Take 25 mg by mouth every morning.    [provider]  HYDROcodone-acetaminophen (NORCO) 5-325 MG tablet Take 1 tablet by mouth every 4 (four) hours as needed. 12/03/15   Elnora Morrison, MD  omeprazole (PRILOSEC) 20 MG capsule Take 20 mg by mouth daily.    [provider]  ranitidine (ZANTAC) 150 MG tablet Take 150 mg by mouth 2 (two) times daily.    [provider]    Family History History reviewed. No pertinent family history.  Social History Social History   Tobacco Use  . Smoking status: Never Smoker  . Smokeless tobacco: Never Used  Substance Use Topics  . Alcohol use: No  . Drug use: No     Allergies   Patient has no known allergies.   Review of Systems Review of Systems  Constitutional: Negative for activity change.       All ROS Neg except as noted in HPI  HENT: Negative for nosebleeds.   Eyes: Negative for photophobia and discharge.  Respiratory: Negative for cough, shortness of breath and wheezing.   Cardiovascular: Negative for chest pain and palpitations.  Gastrointestinal: Negative for abdominal pain and blood in stool.  Genitourinary: Negative for dysuria, frequency and hematuria.  Musculoskeletal: Positive for arthralgias. Negative for back  pain and neck pain.  Skin: Negative.   Neurological: Negative for dizziness, seizures and speech difficulty.  Psychiatric/Behavioral: Negative for confusion and hallucinations.     Physical Exam Updated Vital Signs BP (!) 183/89 (BP Location: Left Arm)   Pulse 68   Temp 98.5 F (36.9 C) (Oral)   Resp 15   Ht 5\' 4"  (1.626 m)   Wt 106.1 kg   SpO2 98%   BMI 40.17 kg/m   Physical Exam  Constitutional: She is oriented to person, place, and time. She appears well-developed and well-nourished.  Non-toxic appearance.  HENT:  Head: Normocephalic.  Right Ear: Tympanic membrane and external ear normal.  Left Ear:  Tympanic membrane and external ear normal.  Eyes: Pupils are equal, round, and reactive to light. EOM and lids are normal.  Neck: Normal range of motion. Neck supple. Carotid bruit is not present.  Cardiovascular: Normal rate, regular rhythm, normal heart sounds, intact distal pulses and normal pulses.  Pulmonary/Chest: Breath sounds normal. No respiratory distress.  Abdominal: Soft. Bowel sounds are normal. There is no tenderness. There is no guarding.  Musculoskeletal:       Right shoulder: She exhibits decreased range of motion and pain. She exhibits no deformity and normal strength.  Lymphadenopathy:       Head (right side): No submandibular adenopathy present.       Head (left side): No submandibular adenopathy present.    She has no cervical adenopathy.  Neurological: She is alert and oriented to person, place, and time. She has normal strength. No cranial nerve deficit or sensory deficit.  Skin: Skin is warm and dry.  Psychiatric: She has a normal mood and affect. Her speech is normal.  Nursing note and vitals reviewed.    ED Treatments / Results  Labs (all labs ordered are listed, but only abnormal results are displayed) Labs Reviewed - No data to display  EKG None  Radiology Dg Shoulder Right  Result Date: 04/19/2018 CLINICAL DATA:  Pain.  Fall 3 weeks prior EXAM: RIGHT SHOULDER - 2+ VIEW COMPARISON:  None. FINDINGS: Oblique, Y scapular, and axillary images obtained. No acute appearing fracture is evident. There is evidence of a prior Hill-Sachs defect along the lateral humeral head. There is osteoarthritic change, moderate in the acromioclavicular joint and mild in the glenohumeral joint. No erosive change. Visualized right lung is clear. IMPRESSION: No acute fracture or dislocation. Hill-Sachs defect lateral humeral head. Osteoarthritic change, most notably in the acromioclavicular joint. Electronically Signed   By: Lowella Grip III M.D.   On: 04/19/2018 19:01     Procedures Procedures (including critical care time)  Medications Ordered in ED Medications - No data to display   Initial Impression / Assessment and Plan / ED Course  I have reviewed the triage vital signs and the nursing notes.  Pertinent labs & imaging results that were available during my care of the patient were reviewed by me and considered in my medical decision making (see chart for details).       Final Clinical Impressions(s) / ED Diagnoses MDM Vital signs reviewed.  Blood pressure is elevated at 183/89 Pt seen by Dr Lita Mains. There is pain and crepitus with attempted range of motion of the right shoulder.  There is no deformity appreciated.  No neurovascular deficits appreciated.X-ray  Shows no acute fracture.  There is osteoarthritic changes at the acromioclavicular joint as well as the glenohumeral joint.  I have discussed the findings with the patient in terms which she  understands.  Patient referred to orthopedics for additional evaluation.  Patient given medication to assist with her pain.  Patient is in agreement with this plan.   Final diagnoses:  Contusion of right shoulder, initial encounter  Primary osteoarthritis of right shoulder    ED Discharge Orders         Ordered    traMADol (ULTRAM) 50 MG tablet     04/19/18 1959    dexamethasone (DECADRON) 4 MG tablet  2 times daily with meals     04/19/18 1959           Lily Kocher, PA-C 04/20/18 1101    Julianne Rice, MD 04/22/18 1540

## 2018-04-19 NOTE — ED Triage Notes (Signed)
Patient fell 3 weeks ago at church stating she did not have her feet all the way in her shoes.  Patient fell on stomach "flat on face", denies hitting her head.  Patient states pain in right shoulder has been hurting ever since fall and has gotten worse today.

## 2018-04-19 NOTE — Discharge Instructions (Signed)
Your blood pressure is elevated at 183/89.  Please have your blood pressure rechecked soon.  The x-ray of your shoulder is negative for fracture or dislocation.  There are multiple areas of arthritis present in your shoulder.  There are no neurologic or vascular deficits appreciated on your upper extremity.  Please apply heat to your shoulder at rest.  Please use extra strength Tylenol every 4 hours for mild pain.  Please use Ultram at bedtime if needed for pain, or every 6 hours if needed for severe pain.  This medication may cause drowsiness.  Please use it with caution.  Please do not drive a vehicle, operate machinery, or participate in activities requiring concentration when taking this medication.  Please use Decadron 2 times daily with food until all taken.  Please see Dr. Aline Brochure for orthopedic evaluation and management of your shoulder issue.

## 2018-05-02 ENCOUNTER — Encounter: Payer: Self-pay | Admitting: Orthopedic Surgery

## 2018-05-02 ENCOUNTER — Ambulatory Visit: Payer: Medicare HMO | Admitting: Orthopedic Surgery

## 2018-05-02 VITALS — BP 140/80 | HR 71 | Ht 63.0 in | Wt 222.0 lb

## 2018-05-02 DIAGNOSIS — M7551 Bursitis of right shoulder: Secondary | ICD-10-CM | POA: Diagnosis not present

## 2018-05-02 MED ORDER — NAPROXEN 500 MG PO TABS: 500.0000 mg | ORAL_TABLET | Freq: Two times a day (BID) | ORAL | 2 refills | Status: DC

## 2018-05-02 NOTE — Progress Notes (Signed)
Established patient new problem  Chief Complaint  Patient presents with  . Shoulder Pain    right since fall about a month ago     67 year old female presents for evaluation of painful right shoulder  She is 67 years old she fell at church 5 weeks ago injured her right shoulder.  She presented to the ER 3 weeks later with right shoulder pain.  She initially thought the pain was getting better but then got worse again.  She complains of peri-acromial right shoulder pain dull ache decreased range of motion decreased ability to bring her arm overhead with moderate severity associated with some stiffness     Review of Systems  Constitutional: Negative for fever.  Respiratory: Negative for shortness of breath.   Cardiovascular: Negative for chest pain.  Skin: Negative.   Neurological: Negative for tingling and sensory change.     Past Medical History:  Diagnosis Date  . Arthritis   . Hyperlipemia   . Hypertension   . Obesity     Past Surgical History:  Procedure Laterality Date  . ABDOMINAL HYSTERECTOMY    . COLONOSCOPY WITH PROPOFOL N/A 03/15/2017   Procedure: COLONOSCOPY WITH PROPOFOL;  Surgeon: Manya Silvas, MD;  Location: [facility] ENDOSCOPY;  Service: Endoscopy;  Laterality: N/A;    Family History  Problem Relation Age of Onset  . Kidney failure Mother   . Cancer Father   . Hypertension Sister   . Cancer Brother   . Heart disease Sister   . Cancer Daughter    Social History   Tobacco Use  . Smoking status: Never Smoker  . Smokeless tobacco: Never Used  Substance Use Topics  . Alcohol use: No  . Drug use: No    No Known Allergies  Current Meds  Medication Sig  . albuterol (PROVENTIL HFA;VENTOLIN HFA) 108 (90 BASE) MCG/ACT inhaler Inhale 2 puffs into the lungs every 4 (four) hours as needed. FOR SHORTNESS OF BREATH  . aspirin EC 81 MG tablet Take 81 mg by mouth daily.  Marland Kitchen atenolol (TENORMIN) 25 MG tablet Take 12.5 mg by mouth every morning.   .  hydrochlorothiazide (HYDRODIURIL) 25 MG tablet Take 25 mg by mouth every morning.    BP 140/80   Pulse 71   Ht 5\' 3"  (1.6 m)   Wt 222 lb (100.7 kg)   BMI 39.33 kg/m   Physical Exam  Constitutional: She is oriented to person, place, and time. She appears well-developed and well-nourished.  Neurological: She is alert and oriented to person, place, and time.  Psychiatric: She has a normal mood and affect. Judgment normal.  Vitals reviewed.   Ortho Exam  Left shoulder full range of motion in left shoulder no tenderness or swelling.  Shoulder stable in abduction external rotation cuff strength is normal skin is warm dry and intact without rash she has a good pulse supraclavicular lymph nodes are nonpalpable and sensation is normal  Right shoulder painful forward elevation tenderness globally around the glenohumeral joint and subdeltoid region in the peri-acromial region.  Her active flexion is 100 degrees with abduction of 90 degrees it is painful passive range of motion is extended to 150 degrees with pain stable in abduction external rotation gross rotator cuff strength is normal skin is intact pulses are good lymph nodes are normal and there are no sensory deficits  MEDICAL DECISION SECTION  Xrays were done at [facility]  My independent reading of xrays:  3 views of shoulder including axillary view  mild glenohumeral arthritis.  There is a comment on the report of a Hill-Sachs lesion which I do not appreciate  Impression mild glenohumeral arthritis.  Encounter Diagnosis  Name Primary?  . Bursitis of right shoulder Yes    PLAN: (Rx., injectx, surgery, frx, mri/ct) We will start with a subacromial injection  Home exercise program  Naprosyn 500 mg twice a day  Follow-up in 6 weeks  The patient meets the AMA guidelines for Morbid (severe) obesity with a BMI > 40.0 and I have recommended weight loss.   Meds ordered this encounter  Medications  . naproxen (NAPROSYN)  500 MG tablet    Sig: Take 1 tablet (500 mg total) by mouth 2 (two) times daily with a meal.    Dispense:  60 tablet    Refill:  2   Procedure note the subacromial injection shoulder RIGHT  Verbal consent was obtained to inject the  RIGHT   Shoulder  Timeout was completed to confirm the injection site is a subacromial space of the  RIGHT  shoulder   Medication used Depo-Medrol 40 mg and lidocaine 1% 3 cc  Anesthesia was provided by ethyl chloride  The injection was performed in the RIGHT  posterior subacromial space. After pinning the skin with alcohol and anesthetized the skin with ethyl chloride the subacromial space was injected using a 20-gauge needle. There were no complications  Sterile dressing was applied.    Arther Abbott, MD  05/02/2018 11:18 AM

## 2018-05-02 NOTE — Patient Instructions (Signed)
Take 1 tablet Naprosyn twice a day  Do exercises 3-4 times a day  You have received an injection of steroids into the joint. 15% of patients will have increased pain within the 24 hours postinjection.   This is transient and will go away.   We recommend that you use ice packs on the injection site for 20 minutes every 2 hours and extra strength Tylenol 2 tablets every 8 as needed until the pain resolves.  If you continue to have pain after taking the Tylenol and using the ice please call the office for further instructions.

## 2018-06-13 ENCOUNTER — Encounter: Payer: Self-pay | Admitting: Orthopedic Surgery

## 2018-06-13 ENCOUNTER — Ambulatory Visit: Payer: Medicare HMO | Admitting: Orthopedic Surgery

## 2018-06-13 VITALS — BP 163/95 | HR 63 | Ht 63.0 in | Wt 226.0 lb

## 2018-06-13 DIAGNOSIS — M7551 Bursitis of right shoulder: Secondary | ICD-10-CM | POA: Diagnosis not present

## 2018-06-13 DIAGNOSIS — S46011D Strain of muscle(s) and tendon(s) of the rotator cuff of right shoulder, subsequent encounter: Secondary | ICD-10-CM

## 2018-06-13 NOTE — Patient Instructions (Addendum)
Call [facility] for MRI appointment: [phone]. After appointment schedule follow up with Dr. Aline Brochure.

## 2018-06-13 NOTE — Progress Notes (Signed)
Chief Complaint  Patient presents with  . Shoulder Pain    Right shoulder pain worse last night    67 year old female now painful right shoulder for 11 weeks treated with naproxen and cortisone injection.  She says her shoulder is worse.  No complaints of numbness or tingling in the right upper extremity neck is asymptomatic  BP (!) 163/95   Pulse 63   Ht 5\' 3"  (1.6 m)   Wt 226 lb (102.5 kg)   BMI 40.03 kg/m   Painful elevation right shoulder to 90 degrees active passive 150 with pain positive impingement drop test negative but weakness of the supraspinatus in the empty can position is grade 3 external rotation weakness grade 4.  Shoulder remains stable skin is intact pulses are good sensation is normal lymph nodes are negative    Encounter Diagnoses  Name Primary?  . Bursitis of right shoulder Yes  . Traumatic complete tear of right rotator cuff, subsequent encounter     I reinjected her right shoulder but she needs to have an open unit MRI due to claustrophobia to evaluate the cuff for traumatic tearing unresponsive to conservative care of at least 6 weeks  Visit on May 02, 2018  established patient new problem  Chief Complaint  Patient presents with  . Shoulder Pain    right since fall about a month ago     67 year old female presents for evaluation of painful right shoulder  She is 67 years old she fell at church 5 weeks ago injured her right shoulder.  She presented to the ER 3 weeks later with right shoulder pain.  She initially thought the pain was getting better but then got worse again.  She complains of peri-acromial right shoulder pain dull ache decreased range of motion decreased ability to bring her arm overhead with moderate severity associated with some stiffness     Review of Systems  Constitutional: Negative for fever.  Respiratory: Negative for shortness of breath.   Cardiovascular: Negative for chest pain.  Skin: Negative.   Neurological:  Negative for tingling and sensory change.

## 2018-06-24 ENCOUNTER — Other Ambulatory Visit: Payer: Medicare HMO

## 2018-06-27 ENCOUNTER — Ambulatory Visit: Payer: Medicare HMO | Admitting: Orthopedic Surgery

## 2018-07-01 ENCOUNTER — Other Ambulatory Visit: Payer: Self-pay | Admitting: Orthopedic Surgery

## 2018-07-05 ENCOUNTER — Ambulatory Visit
Admission: RE | Admit: 2018-07-05 | Discharge: 2018-07-05 | Disposition: A | Discharge status: Home / Self Care | Payer: Medicare HMO | Source: Ambulatory Visit | Attending: Orthopedic Surgery | Admitting: Orthopedic Surgery

## 2018-07-05 DIAGNOSIS — M7551 Bursitis of right shoulder: Secondary | ICD-10-CM

## 2018-07-13 ENCOUNTER — Ambulatory Visit: Payer: Medicare HMO | Admitting: Orthopedic Surgery

## 2018-07-13 ENCOUNTER — Encounter: Payer: Self-pay | Admitting: Orthopedic Surgery

## 2018-07-13 VITALS — BP 169/89 | HR 64 | Ht 63.0 in | Wt 226.0 lb

## 2018-07-13 DIAGNOSIS — M7551 Bursitis of right shoulder: Secondary | ICD-10-CM

## 2018-07-13 NOTE — Addendum Note (Signed)
Addended byCandice Camp on: 07/13/2018 03:43 PM   Modules accepted: Orders

## 2018-07-13 NOTE — Progress Notes (Signed)
FOLLOW UP VISIT : MRI RESULTS   Chief Complaint  Patient presents with  . Shoulder Pain     HPI: The patient is here TO DISCUSS THE RESULTS OF MRI  67 years old painful right shoulder did not respond to nonoperative care sent for MRI findings are below.  Patient still has a catch in the right shoulder decreased range of motion and painful forward elevation   Review of Systems  Musculoskeletal: Negative for neck pain.  Neurological: Negative for tingling.     BP (!) 169/89   Pulse 64   Ht 5\' 3"  (1.6 m)   Wt 226 lb (102.5 kg)   BMI 40.03 kg/m     Medical decision-making section   DATA  MRI REPORT:  IMPRESSION: 1. Tiny focal bursal surface tear of the distal infraspinatus tendon. Moderate supraspinatus and infraspinatus tendinosis. 2. Interstitial tear of the subscapularis tendon. 3. Mild biceps tenosynovitis and intra-articular tendinosis. 4. Mild glenohumeral osteoarthritis. Prominent glenohumeral synovitis. 5. Mild subacromial/subdeltoid bursitis. 6. Findings which can be seen with adhesive capsulitis.     Electronically Signed   By: Titus Dubin M.D.   On: 07/05/2018 16:15      MY READING: MRI OF THE right shoulder standard images.  Tenosynovitis and tendinosis biceps tendon glenohumeral arthritis mild synovitis in the glenohumeral joint subacromial subdeltoid bursitis tendinosis in the supraspinatus infraspinatus tendon bursal surface abrasion infraspinatus tendon   Encounter Diagnosis  Name Primary?  . Bursitis of right shoulder Yes      PLAN: Recommend occupational therapy [facility] 2 times a week 6 weeks.  Call us after last visit for scheduling follow-up appointment

## 2018-07-13 NOTE — Patient Instructions (Signed)
Therapy at [facility] outpatient 2 times a week for 6 weeks after the last therapy visit call the office to get a follow-up appointment

## 2018-07-20 ENCOUNTER — Ambulatory Visit (HOSPITAL_COMMUNITY): Payer: Medicare HMO | Attending: Orthopedic Surgery

## 2018-07-20 DIAGNOSIS — M25611 Stiffness of right shoulder, not elsewhere classified: Secondary | ICD-10-CM | POA: Insufficient documentation

## 2018-07-20 DIAGNOSIS — M25511 Pain in right shoulder: Secondary | ICD-10-CM | POA: Insufficient documentation

## 2018-07-20 DIAGNOSIS — R29898 Other symptoms and signs involving the musculoskeletal system: Secondary | ICD-10-CM | POA: Insufficient documentation

## 2018-08-02 ENCOUNTER — Encounter (HOSPITAL_COMMUNITY): Payer: Self-pay | Admitting: Specialist

## 2018-08-02 ENCOUNTER — Ambulatory Visit (HOSPITAL_COMMUNITY): Payer: Medicare HMO | Admitting: Specialist

## 2018-08-02 ENCOUNTER — Other Ambulatory Visit: Payer: Self-pay

## 2018-08-02 DIAGNOSIS — M25511 Pain in right shoulder: Secondary | ICD-10-CM

## 2018-08-02 DIAGNOSIS — M25611 Stiffness of right shoulder, not elsewhere classified: Secondary | ICD-10-CM

## 2018-08-02 DIAGNOSIS — R29898 Other symptoms and signs involving the musculoskeletal system: Secondary | ICD-10-CM | POA: Diagnosis present

## 2018-08-02 NOTE — Therapy (Signed)
[place], [place], [address] Phone: [phone]   Fax:  [phone]  Occupational Therapy Evaluation  Patient Details  Name: Gabrielle Lowe MRN: [id number] Date of Birth: Apr 23, 1951 Referring Provider (OT): Dr. Arther Abbott   Encounter Date: 08/02/2018  OT End of Session - 08/02/18 1105    Visit Number  1    Number of Visits  12    Date for OT Re-Evaluation  09/13/18    Authorization Type  [employer] medicare no auth required    OT Start Time  0945    OT Stop Time  1030    OT Time Calculation (min)  45 min    Activity Tolerance  Patient tolerated treatment well    Behavior During Therapy  [facility] for tasks assessed/performed       Past Medical History:  Diagnosis Date  . Arthritis   . Hyperlipemia   . Hypertension   . Obesity     Past Surgical History:  Procedure Laterality Date  . ABDOMINAL HYSTERECTOMY    . COLONOSCOPY WITH PROPOFOL N/A 03/15/2017   Procedure: COLONOSCOPY WITH PROPOFOL;  Surgeon: Manya Silvas, MD;  Location: [facility] ENDOSCOPY;  Service: Endoscopy;  Laterality: N/A;    There were no vitals filed for this visit.  Subjective Assessment - 08/02/18 1059    Subjective   S:  I fell a few months ago and hurt my right shoulder.     Pertinent History  Gabrielle Lowe reports falling on 04/19/18 and injuring her right shoulder.  She thought it would improve on its own, and when it did not, she consulted with Dr. Aline Brochure.  An MRI shows tendonitis of the rotator cuff, bursitis, and arthritis.  Patient has been referred to occupational therapy for evaluation and treatment.      Special Tests  FOTO 53/100    Patient Stated Goals  I want to reach up over my head and sleep on my right arm without it hurting.     Currently in Pain?  Yes    Pain Score  5     Pain Location  Shoulder    Pain Orientation  Right    Pain Descriptors / Indicators  Aching    Pain Type  Acute pain    Pain Radiating Towards   upper arm    Pain Onset  More than a month ago    Pain Frequency  Intermittent    Aggravating Factors   use of right arm with daily activities    Pain Relieving Factors  rest, heat, ice    Effect of Pain on Daily Activities  moderate         [facility] OT Assessment - 08/02/18 0001      Assessment   Medical Diagnosis  Right Shoulder Bursitis, Arthritis, Tendonitis     Referring Provider (OT)  Dr. Arther Abbott    Onset Date/Surgical Date  04/19/18    Hand Dominance  Right      Precautions   Precautions  None      Restrictions   Weight Bearing Restrictions  No      Balance Screen   Has the patient fallen in the past 6 months  Yes    How many times?  1    Has the patient had a decrease in activity level because of a fear of falling?   No    Is the patient reluctant to leave their home because of a fear of  falling?   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, bathing, dressing and transferring patient     Leisure  enjoys fishing, Public librarian, old music, cooking, and flowers       ADL   ADL comments  unable to use her dominant right arm to reach above shoulder height, reach behind her head or back or lift anything above waist height      Written Expression   Dominant Hand  Right      Cognition   Overall Cognitive Status  Within Functional Limits for tasks assessed      Observation/Other Assessments   Focus on Therapeutic Outcomes (FOTO)   53/100      Sensation   Light Touch  Appears Intact      Coordination   Gross Motor Movements are Fluid and Coordinated  Yes    Fine Motor Movements are Fluid and Coordinated  Yes      ROM / Strength   AROM / PROM / Strength  AROM;PROM;Strength      Palpation   Palpation comment  moderate  fascial restrictions in right shoulder and scapular region      AROM   Overall AROM Comments  assessed in seated, external and internal rotation with shoulder adducted     AROM Assessment Site  Shoulder    Right/Left Shoulder  Right    Right Shoulder Flexion  90 Degrees    Right Shoulder ABduction  65 Degrees    Right Shoulder Internal Rotation  90 Degrees    Right Shoulder External Rotation  60 Degrees      PROM   Overall PROM Comments  assessed in supine    PROM Assessment Site  Shoulder    Right/Left Shoulder  Right    Right Shoulder Flexion  110 Degrees    Right Shoulder ABduction  100 Degrees    Right Shoulder Internal Rotation  90 Degrees    Right Shoulder External Rotation  85 Degrees      Strength   Overall Strength Comments  assessed in seated, in given range    Strength Assessment Site  Shoulder    Right/Left Shoulder  Right    Right Shoulder Flexion  4-/5    Right Shoulder ABduction  4-/5    Right Shoulder Internal Rotation  4-/5    Right Shoulder External Rotation  4-/5               OT Treatments/Exercises (OP) - 08/02/18 0001      Exercises   Exercises  Shoulder      Shoulder Exercises: Supine   External Rotation  PROM;5 reps    Internal Rotation  PROM;5 reps    Flexion  PROM;5 reps    ABduction  PROM;5 reps      Manual Therapy   Manual Therapy  Myofascial release    Manual therapy comments  manual therapy completed seperately than all other interventions this date of service    Myofascial Release  myofascial release and manual stretching to right upper arm, scapular and shoulder region to decrease pain and restrictions and improve pain free mobility in right shoulder region  OT Education - 08/02/18 1105    Education Details  educated patient on proper technique for pendulum swings, table slides     Person(s) Educated  Patient    Methods  Explanation;Demonstration;Handout    Comprehension  Verbalized understanding;Returned  demonstration       OT Short Term Goals - 08/02/18 1112      OT SHORT TERM GOAL #1   Title  Patient will be educated to decrease pain and improve pain free mobilty needed for daily tasks.     Time  6    Period  Weeks    Status  New    Target Date  09/13/18      OT SHORT TERM GOAL #2   Title  Patient will increase A/ROM to [facility] and P/ROM to WNL in right shoulder for improved abilty to comb hair and fasten bra.     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     Time  6    Period  Weeks    Status  New      OT SHORT TERM GOAL #4   Title  Patient will decrease pain in right shoulder to 2/10 or better when sleeping on her right side at night.     Time  6    Period  Weeks    Status  New      OT SHORT TERM GOAL #5   Title  Patient will decrease fascial restrictions in his right shoulder to minimal in order to improve independence with functional activities.     Time  6    Period  Weeks    Status  New               Plan - 08/02/18 1107    Clinical Impression Statement  A:  Patient is a 67 year old female with past medical history significant for asthma and htn.  Patient fell on her right shoulder in September and has been experiencing significant amounts of pain due to bursitis, arthritis, and tendonitis in her dominant right arm.  Patient works full time and is having diffculty completing work tasks, reaching overhead into cabinets, behind her head to comb her hair and unable to fasten her bra.  Patient will benefit from skilled OT intervention to increase pain free a/rom and strength in order to return to use of right arm as dominant with all daily activiites.     Occupational Profile and client history currently impacting functional performance  good health, motivated to improve    Occupational performance deficits (Please  refer to evaluation for details):  ADL's;IADL's;Rest and Sleep;Work;Leisure;Social Participation    Rehab Potential  Good    OT Frequency  2x / week    OT Duration  6 weeks    OT Treatment/Interventions  Self-care/ADL training;Therapeutic exercise;Manual Therapy;Neuromuscular education;Ultrasound;Electrical Stimulation;Moist Heat;Iontophoresis;Passive range of motion;Therapeutic activities;Patient/family education    Plan  P:  Skilled OT intervention to decrease pain and restrictions and improve pain free mobility in her dominant right shoulder.  Next session: review HEP, begin p/aarom and progress as tolerated.     Clinical Decision Making  Limited treatment options, no task modification necessary    OT Home Exercise Plan  table slides and pendulums     Consulted and  Agree with Plan of Care  Patient       Patient will benefit from skilled therapeutic intervention in order to improve the following deficits and impairments:  Increased muscle spasms, Pain, Decreased range of motion, Decreased strength, Increased fascial restrictions, Impaired UE functional use  Visit Diagnosis: Acute pain of right shoulder  Stiffness of right shoulder, not elsewhere classified  Other symptoms and signs involving the musculoskeletal system    Problem List There are no active problems to display for this patient.   Vangie Bicker, [place], OTR/L [phone]  08/02/2018, 11:31 AM  [place] [address] [place], [place], [address] Phone: [phone]   Fax:  [phone]  Name: JORDIN DAMBROSIO MRN: [id number] Date of Birth: 1951-03-12

## 2018-08-02 NOTE — Patient Instructions (Signed)
SHOULDER: Flexion On Table   Place hands on towel placed on table, elbows straight. Lean forward with you upper body, pushing towel away from body. Hold ___ seconds. ___ reps per set, ___ sets per day  Abduction (Passive)   With arm out to side, resting on towel placed on table, keeping trunk away from table, lean to the side while pushing towel away from body. Hold ____ seconds. Repeat ____ times. Do ____ sessions per day.  Copyright  VHI. All rights reserved.     Internal Rotation (Assistive)   Seated with elbow bent at right angle and held against side, slide arm on table surface in an inward arc keeping elbow anchored in place. Repeat ____ times. Do ____ sessions per day. Activity: Use this motion to brush crumbs off the table.  Copyright  VHI. All rights reserved.  Pendulum Forward/Back    Bend forward 90 at waist, using table for support. Rock body forward and back to swing arm. Repeat ____ times. Do ____ sessions per day.  Copyright  VHI. All rights reserved.  Pendulum Circular    Bend forward 90 at waist, leaning on table for support. Rock body in a circular pattern to move arm clockwise ____ times then counterclockwise ____ times. Do ____ sessions per day.  Copyright  VHI. All rights reserved.

## 2018-08-05 ENCOUNTER — Encounter (HOSPITAL_COMMUNITY): Payer: Self-pay | Admitting: Specialist

## 2018-08-05 ENCOUNTER — Ambulatory Visit (HOSPITAL_COMMUNITY): Payer: Medicare HMO | Admitting: Specialist

## 2018-08-05 DIAGNOSIS — M25511 Pain in right shoulder: Secondary | ICD-10-CM

## 2018-08-05 DIAGNOSIS — M25611 Stiffness of right shoulder, not elsewhere classified: Secondary | ICD-10-CM

## 2018-08-05 DIAGNOSIS — R29898 Other symptoms and signs involving the musculoskeletal system: Secondary | ICD-10-CM

## 2018-08-05 NOTE — Therapy (Signed)
[place], [place], [address] Phone: [phone]   Fax:  [phone]  Occupational Therapy Treatment  Patient Details  Name: Gabrielle Lowe MRN: [id number] Date of Birth: October 20, 1950 Referring Provider (OT): Dr. Arther Abbott   Encounter Date: 08/05/2018  OT End of Session - 08/05/18 1210    Visit Number  2    Number of Visits  12    Date for OT Re-Evaluation  09/13/18    Authorization Type  [employer] medicare no auth required    OT Start Time  1120    OT Stop Time  1200    OT Time Calculation (min)  40 min    Activity Tolerance  Patient tolerated treatment well    Behavior During Therapy  [facility] for tasks assessed/performed       Past Medical History:  Diagnosis Date  . Arthritis   . Hyperlipemia   . Hypertension   . Obesity     Past Surgical History:  Procedure Laterality Date  . ABDOMINAL HYSTERECTOMY    . COLONOSCOPY WITH PROPOFOL N/A 03/15/2017   Procedure: COLONOSCOPY WITH PROPOFOL;  Surgeon: Manya Silvas, MD;  Location: [facility] ENDOSCOPY;  Service: Endoscopy;  Laterality: N/A;    There were no vitals filed for this visit.  Subjective Assessment - 08/05/18 1120    Subjective   S:  The manual therapy really felt good.  I catch myself leaning to pick my arm up, I need to work on that.     Currently in Pain?  No/denies         [facility] OT Assessment - 08/05/18 0001      Assessment   Medical Diagnosis  Right Shoulder Bursitis, Arthritis, Tendonitis       Precautions   Precautions  None      Restrictions   Weight Bearing Restrictions  No               OT Treatments/Exercises (OP) - 08/05/18 0001      Exercises   Exercises  Shoulder      Shoulder Exercises: Supine   Protraction  PROM;AAROM;10 reps    Horizontal ABduction  PROM;AAROM;10 reps    External Rotation  PROM;AAROM;10 reps    Internal Rotation  PROM;AAROM;10 reps    Flexion  PROM;AAROM;10 reps    ABduction   PROM;AAROM;10 reps      Shoulder Exercises: Seated   Elevation  AROM;10 reps    Extension  AROM;10 reps    Row  AROM;10 reps      Shoulder Exercises: Therapy Ball   Flexion  15 reps    ABduction  15 reps      Manual Therapy   Manual Therapy  Myofascial release    Manual therapy comments  manual therapy completed seperately than all other interventions this date of service    Myofascial Release  myofascial release and manual stretching to right upper arm, scapular and shoulder region to decrease pain and restrictions and improve pain free mobility in right shoulder region               OT Short Term Goals - 08/05/18 1212      OT SHORT TERM GOAL #1   Title  Patient will be educated to decrease pain and improve pain free mobilty needed for daily tasks.     Time  6    Period  Weeks    Status  On-going      OT  SHORT TERM GOAL #2   Title  Patient will increase A/ROM to [facility] and P/ROM to WNL in right shoulder for improved abilty to comb hair and fasten bra.     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     Time  6    Period  Weeks    Status  On-going      OT SHORT TERM GOAL #4   Title  Patient will decrease pain in right shoulder to 2/10 or better when sleeping on her right side at night.     Time  6    Period  Weeks    Status  On-going      OT SHORT TERM GOAL #5   Title  Patient will decrease fascial restrictions in his right shoulder to minimal in order to improve independence with functional activities.     Time  6    Period  Weeks    Status  On-going               Plan - 08/05/18 1210    Clinical Impression Statement  A:  Patient states manual therapy is helping to alleviate pain in her left shoulder region.  Pain is no longer constant, instead hurts mostly at night.  Continued manual therapy  intervention to address remaining fascial restrictions that are limiiting full pain free mobility.      Plan  P:  attempt AA/ROM in seated position, complete prot/ret/elev/dep with less facilitation and cuing.  attempt ball circles.        Patient will benefit from skilled therapeutic intervention in order to improve the following deficits and impairments:  Increased muscle spasms, Pain, Decreased range of motion, Decreased strength, Increased fascial restrictions, Impaired UE functional use  Visit Diagnosis: Acute pain of right shoulder  Stiffness of right shoulder, not elsewhere classified  Other symptoms and signs involving the musculoskeletal system    Problem List There are no active problems to display for this patient.   Vangie Bicker, [place], OTR/L [phone]  08/05/2018, 12:13 PM  [place] [address] [place], [place], [address] Phone: [phone]   Fax:  [phone]  Name: Gabrielle Lowe MRN: [id number] Date of Birth: [birth year]

## 2018-08-11 ENCOUNTER — Encounter (HOSPITAL_COMMUNITY): Payer: Medicare HMO | Admitting: Specialist

## 2018-08-11 ENCOUNTER — Telehealth (HOSPITAL_COMMUNITY): Payer: Self-pay | Admitting: Family Medicine

## 2018-08-11 NOTE — Telephone Encounter (Signed)
08/11/18  pt called to cx said that she was just to sick to come up here

## 2018-08-15 ENCOUNTER — Other Ambulatory Visit (HOSPITAL_COMMUNITY): Payer: Self-pay | Admitting: Internal Medicine

## 2018-08-15 DIAGNOSIS — Z780 Asymptomatic menopausal state: Secondary | ICD-10-CM

## 2018-08-17 ENCOUNTER — Ambulatory Visit (HOSPITAL_COMMUNITY): Payer: Medicare HMO | Attending: Orthopedic Surgery

## 2018-08-17 ENCOUNTER — Encounter (HOSPITAL_COMMUNITY): Payer: Self-pay

## 2018-08-17 DIAGNOSIS — M25511 Pain in right shoulder: Secondary | ICD-10-CM | POA: Diagnosis not present

## 2018-08-17 DIAGNOSIS — M25611 Stiffness of right shoulder, not elsewhere classified: Secondary | ICD-10-CM | POA: Diagnosis present

## 2018-08-17 DIAGNOSIS — R29898 Other symptoms and signs involving the musculoskeletal system: Secondary | ICD-10-CM | POA: Insufficient documentation

## 2018-08-17 NOTE — Patient Instructions (Signed)
Perform each exercise _____10-12___ reps. 2-3x days.   Protraction - STANDING  Start by holding a wand or cane at chest height.  Next, slowly push the wand outwards in front of your body so that your elbows become fully straightened. Then, return to the original position.     Shoulder FLEXION - STANDING - PALMS DOWN  In the standing position, hold a wand/cane with both arms, palms down on both sides. Raise up the wand/cane allowing your unaffected arm to perform most of the effort. Your affected arm should be partially relaxed.      Internal/External ROTATION - STANDING  In the standing position, hold a wand/cane with both hands keeping your elbows bent. Move your arms and wand/cane to one side.  Your affected arm should be partially relaxed while your unaffected arm performs most of the effort.       Shoulder ABDUCTION - STANDING  While holding a wand/cane palm face up on the injured side and palm face down on the uninjured side, slowly raise up your injured arm to the side.       Horizontal Abduction/Adduction      Straight arms holding cane at shoulder height, bring cane to right, center, left. Repeat starting to left.   Copyright  VHI. All rights reserved.       

## 2018-08-18 NOTE — Therapy (Signed)
[place]ri[place] Lowe, [place], [address] Phone: [phone]   Fax:  [phone]  Occupational Therapy Treatment  Patient Details  Name: Gabrielle Lowe MRN: [id number] Date of Birth: [birth year] Referring Provider (OT): Dr. Arther Abbott   Encounter Date: 08/17/2018  OT End of Session - 08/18/18 1004    Visit Number  3    Number of Visits  12    Date for OT Re-Evaluation  09/13/18    Authorization Type  [employer] medicare no auth required    OT Start Time  1608    OT Stop Time  1645    OT Time Calculation (min)  37 min    Activity Tolerance  Patient tolerated treatment well    Behavior During Therapy  [facility] for tasks assessed/performed       Past Medical History:  Diagnosis Date  . Arthritis   . Hyperlipemia   . Hypertension   . Obesity     Past Surgical History:  Procedure Laterality Date  . ABDOMINAL HYSTERECTOMY    . COLONOSCOPY WITH PROPOFOL N/A 03/15/2017   Procedure: COLONOSCOPY WITH PROPOFOL;  Surgeon: Manya Silvas, MD;  Location: [facility] ENDOSCOPY;  Service: Endoscopy;  Laterality: N/A;    There were no vitals filed for this visit.  Subjective Assessment - 08/17/18 1626    Subjective   S: When my car brake was on tight from the technician and it hurt my arm to get it off.     Currently in Pain?  No/denies         [facility] OT Assessment - 08/17/18 1627      Assessment   Medical Diagnosis  Right Shoulder Bursitis, Arthritis, Tendonitis       Precautions   Precautions  None               OT Treatments/Exercises (OP) - 08/17/18 1627      Exercises   Exercises  Shoulder      Shoulder Exercises: Supine   Protraction  PROM;AAROM;10 reps    Horizontal ABduction  PROM;AAROM;10 reps    External Rotation  PROM;AAROM;10 reps    Internal Rotation  PROM;AAROM;10 reps    Flexion  PROM;AAROM;10 reps    ABduction  PROM;AAROM;10 reps      Shoulder Exercises: Seated   Protraction  AAROM;10 reps    Horizontal ABduction  AAROM;10 reps    External Rotation  AAROM;10 reps    Internal Rotation  AAROM;10 reps    Flexion  AAROM;10 reps    Abduction  AAROM;10 reps      Shoulder Exercises: Therapy Ball   Right/Left  5 reps      Shoulder Exercises: ROM/Strengthening   Prot/Ret//Elev/Dep  1'      Manual Therapy   Manual Therapy  Myofascial release    Manual therapy comments  manual therapy completed seperately than all other interventions this date of service    Myofascial Release  myofascial release and manual stretching to right upper arm, scapular and shoulder region to decrease pain and restrictions and improve pain free mobility in right shoulder region             OT Education - 08/18/18 1003    Education Details  Updated HEP to include AA/ROM shoulder exercises. pt may stop previous exercises.     Person(s) Educated  Patient    Methods  Explanation;Demonstration;Verbal cues;Handout    Comprehension  Returned demonstration;Verbalized understanding  OT Short Term Goals - 08/05/18 1212      OT SHORT TERM GOAL #1   Title  Patient will be educated to decrease pain and improve pain free mobilty needed for daily tasks.     Time  6    Period  Weeks    Status  On-going      OT SHORT TERM GOAL #2   Title  Patient will increase A/ROM to [facility] and P/ROM to WNL in right shoulder for improved abilty to comb hair and fasten bra.     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     Time  6    Period  Weeks    Status  On-going      OT SHORT TERM GOAL #4   Title  Patient will decrease pain in right shoulder to 2/10 or better when sleeping on her right side at night.     Time  6    Period  Weeks    Status  On-going      OT SHORT TERM GOAL #5   Title  Patient will decrease fascial restrictions in his right shoulder to  minimal in order to improve independence with functional activities.     Time  6    Period  Weeks    Status  On-going               Plan - 08/18/18 1005    Clinical Impression Statement  A: Patient reports increased pain in her right shoulder after her car tire was replaced and the technician placed her emergency brake on to high. She had pain when trying to push it back down. Today, she reports her pain is 0/10 at rest. Pt was able to complete AA/ROM seated. HEP was updated. Added therapy ball circles to focus on increasing shoulder ROM for internal rotation. Task was moderately difficult. Manual techniques completed to address fascial restrictions.     Plan  P: Have patient complete and sign Patient summary form for insurance and give to front office staff to submit. Complete manual techiques if needed. Continue with AA/ROM shoulder exercises. Add wall wash and UBE bike.     Consulted and Agree with Plan of Care  Patient       Patient will benefit from skilled therapeutic intervention in order to improve the following deficits and impairments:  Increased muscle spasms, Pain, Decreased range of motion, Decreased strength, Increased fascial restrictions, Impaired UE functional use  Visit Diagnosis: Acute pain of right shoulder  Stiffness of right shoulder, not elsewhere classified  Other symptoms and signs involving the musculoskeletal system    Problem List There are no active problems to display for this patient.  [name], OTR/L,CBIS  [phone]  08/18/2018, 10:09 AM  [place] [address] [place], [place], [address] Phone: [phone]   Fax:  [phone]  Name: Gabrielle Lowe MRN: [id number] Date of Birth: 1951-04-30

## 2018-08-19 ENCOUNTER — Encounter

## 2018-08-19 ENCOUNTER — Ambulatory Visit (HOSPITAL_COMMUNITY): Payer: Medicare HMO

## 2018-08-19 ENCOUNTER — Encounter (HOSPITAL_COMMUNITY): Payer: Self-pay

## 2018-08-19 DIAGNOSIS — M25611 Stiffness of right shoulder, not elsewhere classified: Secondary | ICD-10-CM

## 2018-08-19 DIAGNOSIS — R29898 Other symptoms and signs involving the musculoskeletal system: Secondary | ICD-10-CM

## 2018-08-19 DIAGNOSIS — M25511 Pain in right shoulder: Secondary | ICD-10-CM

## 2018-08-19 NOTE — Therapy (Signed)
[place], [place], [address] Phone: [phone]   Fax:  [phone]  Occupational Therapy Treatment  Patient Details  Name: Gabrielle Lowe MRN: [id number] Date of Birth: 10-03-1950 Referring Provider (OT): Dr. Arther Abbott   Encounter Date: 08/19/2018  OT End of Session - 08/19/18 1733    Visit Number  4    Number of Visits  12    Date for OT Re-Evaluation  09/13/18    Authorization Type  [employer] medic[employer] no auth required    OT Start Time  1607    OT Stop Time  1645    OT Time Calculation (min)  38 min    Activity Tolerance  Patient tolerated treatment well    Behavior During Therapy  [facility] for tasks assessed/performed       Past Medical History:  Diagnosis Date  . Arthritis   . Hyperlipemia   . Hypertension   . Obesity     Past Surgical History:  Procedure Laterality Date  . ABDOMINAL HYSTERECTOMY    . COLONOSCOPY WITH PROPOFOL N/A 03/15/2017   Procedure: COLONOSCOPY WITH PROPOFOL;  Surgeon: Manya Silvas, MD;  Location: [facility] ENDOSCOPY;  Service: Endoscopy;  Laterality: N/A;    There were no vitals filed for this visit.  Subjective Assessment - 08/19/18 1624    Subjective   S: It was really hurting after last session. It just stopped.     Currently in Pain?  No/denies         [facility] OT Assessment - 08/19/18 1625      Assessment   Medical Diagnosis  Right Shoulder Bursitis, Arthritis, Tendonitis       Precautions   Precautions  None               OT Treatments/Exercises (OP) - 08/19/18 1625      Exercises   Exercises  Shoulder      Shoulder Exercises: Supine   Protraction  PROM;AAROM;10 reps    Horizontal ABduction  PROM;AAROM;10 reps    External Rotation  PROM;AAROM;10 reps    Internal Rotation  PROM;AAROM;10 reps    Flexion  PROM;AAROM;10 reps    ABduction  PROM;AAROM;10 reps      Shoulder Exercises: Seated   Protraction  AAROM;10 reps    Horizontal ABduction   AAROM;10 reps    External Rotation  AAROM;10 reps    Internal Rotation  AAROM;10 reps    Flexion  AAROM;10 reps    Abduction  AAROM;10 reps      Shoulder Exercises: ROM/Strengthening   Wall Wash  1'    Prot/Ret//Elev/Dep  1'      Manual Therapy   Manual Therapy  Myofascial release    Manual therapy comments  manual therapy completed seperately than all other interventions this date of service    Myofascial Release  myofascial release and manual stretching to right upper arm, scapular and shoulder region to decrease pain and restrictions and improve pain free mobility in right shoulder region               OT Short Term Goals - 08/05/18 1212      OT SHORT TERM GOAL #1   Title  Patient will be educated to decrease pain and improve pain free mobilty needed for daily tasks.     Time  6    Period  Weeks    Status  On-going      OT SHORT TERM GOAL #2  Title  Patient will increase A/ROM to WFL and P/ROM to WNL in right shoulder for improved abilty to comb hair and fasten bra.     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     Time  6    Period  Weeks    Status  On-going      OT SHORT TERM GOAL #4   Title  Patient will decrease pain in right shoulder to 2/10 or better when sleeping on her right side at night.     Time  6    Period  Weeks    Status  On-going      OT SHORT TERM GOAL #5   Title  Patient will decrease fascial restrictions in his right shoulder to minimal in order to improve independence with functional activities.     Time  6    Period  Weeks    Status  On-going               Plan - 08/19/18 1733    Clinical Impression Statement  A: Pt reports increased soreness after last session that lasted for a day. No pain at start of today's session. Per therapist did not add UBE bike or continue with  therapy ball circles to assess soreness after this session. Added wall wash with patient reporting no increased pain. Manual techniques completed to address fascial restrictions.     Plan  P: Complete manual techniques if needed. Continue AA/ROM shoulder exercises. Add UBE bike.     Consulted and Agree with Plan of Care  Patient       Patient will benefit from skilled therapeutic intervention in order to improve the following deficits and impairments:  Increased muscle spasms, Pain, Decreased range of motion, Decreased strength, Increased fascial restrictions, Impaired UE functional use  Visit Diagnosis: Other symptoms and signs involving the musculoskeletal system  Stiffness of right shoulder, not elsewhere classified  Acute pain of right shoulder    Problem List There are no active problems to display for this patient.  Laura Essenmacher, OTR/L,CBIS  [phone]  08/19/2018, 5:38 PM  [place] [facility] [address] [place], , [address] Phone: [phone]   Fax:  [phone]  Name: Gabrielle Lowe MRN: [id number] Date of Birth: 08/22/1950 

## 2018-08-22 ENCOUNTER — Telehealth (HOSPITAL_COMMUNITY): Payer: Self-pay | Admitting: Occupational Therapy

## 2018-08-22 NOTE — Telephone Encounter (Signed)
Print new schedule let pt know 2/6 was cx due to LT not here on that date. NF 08/22/2018

## 2018-08-23 ENCOUNTER — Encounter (HOSPITAL_COMMUNITY): Payer: Self-pay

## 2018-08-23 ENCOUNTER — Ambulatory Visit (HOSPITAL_COMMUNITY): Payer: Medicare HMO

## 2018-08-23 DIAGNOSIS — M25511 Pain in right shoulder: Secondary | ICD-10-CM

## 2018-08-23 DIAGNOSIS — M25611 Stiffness of right shoulder, not elsewhere classified: Secondary | ICD-10-CM

## 2018-08-23 DIAGNOSIS — R29898 Other symptoms and signs involving the musculoskeletal system: Secondary | ICD-10-CM

## 2018-08-23 NOTE — Therapy (Signed)
[place], [place], [address] Phone: [phone]   Fax:  [phone]  Occupational Therapy Treatment  Patient Details  Name: Gabrielle Lowe MRN: [id number] Date of Birth: 14-Jun-1951 Referring Provider (OT): Dr. Arther Abbott   Encounter Date: 08/23/2018  OT End of Session - 08/23/18 1630    Visit Number  5    Number of Visits  12    Date for OT Re-Evaluation  09/13/18    Authorization Type  [employer] medicare no auth required    OT Start Time  1605    OT Stop Time  1643    OT Time Calculation (min)  38 min    Activity Tolerance  Patient tolerated treatment well    Behavior During Therapy  [facility] for tasks assessed/performed       Past Medical History:  Diagnosis Date  . Arthritis   . Hyperlipemia   . Hypertension   . Obesity     Past Surgical History:  Procedure Laterality Date  . ABDOMINAL HYSTERECTOMY    . COLONOSCOPY WITH PROPOFOL N/A 03/15/2017   Procedure: COLONOSCOPY WITH PROPOFOL;  Surgeon: Manya Silvas, MD;  Location[facility] ENDOSCOPY;  Service: Endoscopy;  Laterality: N/A;    There were no vitals filed for this visit.  Subjective Assessment - 08/23/18 1612    Subjective   S: I worked on this arm a lot over the weekend.     Currently in Pain?  No/denies         [facility] OT Assessment - 08/23/18 1612      Assessment   Medical Diagnosis  Right Shoulder Bursitis, Arthritis, Tendonitis       Precautions   Precautions  None               OT Treatments/Exercises (OP) - 08/23/18 1613      Exercises   Exercises  Shoulder      Shoulder Exercises: Supine   Protraction  PROM;5 reps;AROM;10 reps    Horizontal ABduction  PROM;5 reps;AROM;10 reps    External Rotation  PROM;5 reps;AROM;10 reps    Internal Rotation  PROM;5 reps;AROM;10 reps    Flexion  PROM;5 reps;AROM;10 reps    ABduction  PROM;5 reps;AROM;10 reps      Shoulder Exercises: Seated   Protraction  AROM;10 reps    Horizontal ABduction  AROM;10 reps    External Rotation  AROM;10 reps    Internal Rotation  AROM;10 reps    Flexion  AROM;10 reps    Abduction  AROM;10 reps      Shoulder Exercises: Standing   Extension  Theraband;10 reps    Theraband Level (Shoulder Extension)  Level 2 (Red)    Row  Theraband;10 reps    Theraband Level (Shoulder Row)  Level 2 (Red)      Shoulder Exercises: ROM/Strengthening   UBE (Upper Arm Bike)  Level 1 2' forward 2' reverse   pace: 4.5-5.0   Over Head Lace  1' (seated at [employer])    Proximal Shoulder Strengthening, Supine  A/ROM 10X    Proximal Shoulder Strengthening, Seated  A/ROM 10X    Other ROM/Strengthening Exercises  PVC pipe slide 10X      Manual Therapy   Manual Therapy  Myofascial release    Manual therapy comments  manual therapy completed seperately than all other interventions this date of service    Myofascial Release  myofascial release and manual stretching to right upper arm, scapular and shoulder region  to decrease pain and restrictions and improve pain free mobility in right shoulder region             OT Education - 08/23/18 1637    Education Details  A/ROM shoulder exercises    Person(s) Educated  Patient    Methods  Explanation;Demonstration;Verbal cues;Handout    Comprehension  Returned demonstration;Verbalized understanding       OT Short Term Goals - 08/05/18 1212      OT SHORT TERM GOAL #1   Title  Patient will be educated to decrease pain and improve pain free mobilty needed for daily tasks.     Time  6    Period  Weeks    Status  On-going      OT SHORT TERM GOAL #2   Title  Patient will increase A/ROM to [facility] and P/ROM to WNL in right shoulder for improved abilty to comb hair and fasten bra.     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     Time  6    Period   Weeks    Status  On-going      OT SHORT TERM GOAL #4   Title  Patient will decrease pain in right shoulder to 2/10 or better when sleeping on her right side at night.     Time  6    Period  Weeks    Status  On-going      OT SHORT TERM GOAL #5   Title  Patient will decrease fascial restrictions in his right shoulder to minimal in order to improve independence with functional activities.     Time  6    Period  Weeks    Status  On-going               Plan - 08/23/18 1638    Clinical Impression Statement  A: pt arrives with reports of working all weekend on her HEP. She reports that her shoulder is only a little sore in one area. She was able to progress to A/ROM shoulder exercises. We added scapular strengthening with the red band and UBE bike for shoulder and scapular strengthening. VC for form and technque.     Plan  P: Complete manual techniques if needed. Follow up on HEP. If patient did not experience increased pain from last session continue with A/ROM and progress as able to tolerate.     Consulted and Agree with Plan of Care  Patient       Patient will benefit from skilled therapeutic intervention in order to improve the following deficits and impairments:  Increased muscle spasms, Pain, Decreased range of motion, Decreased strength, Increased fascial restrictions, Impaired UE functional use  Visit Diagnosis: Other symptoms and signs involving the musculoskeletal system  Stiffness of right shoulder, not elsewhere classified  Acute pain of right shoulder    Problem List There are no active problems to display for this patient.  [name], OTR/L,CBIS  [phone]  08/23/2018, 4:43 PM  [facility] [address] [place], [place], [address] Phone: [phone]   Fax:  [phone]  Name: Gabrielle Lowe MRN: [id number] Date of Birth: 01/15/1951

## 2018-08-23 NOTE — Patient Instructions (Signed)
Repeat all exercises 10-15 times, 1-2 times per day.  1) Shoulder Protraction    Begin with elbows by your side, slowly "punch" straight out in front of you.      2) Shoulder Flexion   Standing:         Begin with arms at your side with thumbs pointed up, slowly raise both arms up and forward towards overhead.       3) Horizontal abduction/adduction  Standing:           Begin with arms straight out in front of you, bring out to the side in at "T" shape. Keep arms straight entire time.         4) Internal & External Rotation    *No band* -Stand with elbows at the side and elbows bent 90 degrees. Move your forearms away from your body, then bring back inward toward the body.     5) Shoulder Abduction  Standing:        begin with your arms flat on the table next to your side. Slowly move your arms out to the side so that they go overhead, in a jumping jack or snow angel movement.

## 2018-08-24 ENCOUNTER — Ambulatory Visit (HOSPITAL_COMMUNITY)
Admission: RE | Admit: 2018-08-24 | Discharge: 2018-08-24 | Disposition: A | Discharge status: Home / Self Care | Payer: Medicare HMO | Source: Ambulatory Visit | Attending: Internal Medicine | Admitting: Internal Medicine

## 2018-08-24 DIAGNOSIS — Z780 Asymptomatic menopausal state: Secondary | ICD-10-CM | POA: Diagnosis not present

## 2018-08-25 ENCOUNTER — Encounter (HOSPITAL_COMMUNITY): Payer: Self-pay | Admitting: Occupational Therapy

## 2018-08-25 ENCOUNTER — Ambulatory Visit (HOSPITAL_COMMUNITY): Payer: Medicare HMO | Admitting: Occupational Therapy

## 2018-08-25 DIAGNOSIS — M25611 Stiffness of right shoulder, not elsewhere classified: Secondary | ICD-10-CM

## 2018-08-25 DIAGNOSIS — M25511 Pain in right shoulder: Secondary | ICD-10-CM | POA: Diagnosis not present

## 2018-08-25 DIAGNOSIS — R29898 Other symptoms and signs involving the musculoskeletal system: Secondary | ICD-10-CM

## 2018-08-25 NOTE — Therapy (Signed)
[place], [place], [address] Phone: [phone]   Fax:  [phone]  Occupational Therapy Treatment  Patient Details  Name: Gabrielle Lowe MRN: [id number] Date of Birth: [birth year] Referring Provider (OT): Dr. Arther Abbott   Encounter Date: 08/25/2018  OT End of Session - 08/25/18 1636    Visit Number  6    Number of Visits  12    Date for OT Re-Evaluation  09/13/18    Authorization Type  [employer] medicare no auth required    OT Start Time  1601    OT Stop Time  1639    OT Time Calculation (min)  38 min    Activity Tolerance  Patient tolerated treatment well    Behavior During Therapy  [facility] for tasks assessed/performed       Past Medical History:  Diagnosis Date  . Arthritis   . Hyperlipemia   . Hypertension   . Obesity     Past Surgical History:  Procedure Laterality Date  . ABDOMINAL HYSTERECTOMY    . COLONOSCOPY WITH PROPOFOL N/A 03/15/2017   Procedure: COLONOSCOPY WITH PROPOFOL;  Surgeon: Manya Silvas, MD;  Location: [facility] ENDOSCOPY;  Service: Endoscopy;  Laterality: N/A;    There were no vitals filed for this visit.  Subjective Assessment - 08/25/18 1600    Subjective   S: It's not hurting today.     Currently in Pain?  No/denies         [facility] OT Assessment - 08/25/18 1600      Assessment   Medical Diagnosis  Right Shoulder Bursitis, Arthritis, Tendonitis       Precautions   Precautions  None               OT Treatments/Exercises (OP) - 08/25/18 1603      Exercises   Exercises  Shoulder      Shoulder Exercises: Supine   Protraction  PROM;5 reps;AROM;10 reps    Horizontal ABduction  PROM;5 reps;AROM;10 reps    External Rotation  PROM;5 reps;AROM;10 reps    Internal Rotation  PROM;5 reps;AROM;10 reps    Flexion  PROM;5 reps;AROM;10 reps    ABduction  PROM;5 reps;AROM;10 reps      Shoulder Exercises: Seated   Protraction  AROM;10 reps    Horizontal ABduction   AROM;10 reps    External Rotation  AROM;10 reps    Internal Rotation  AROM;10 reps    Flexion  AROM;10 reps    Abduction  AROM;10 reps      Shoulder Exercises: Standing   Extension  Theraband;10 reps    Theraband Level (Shoulder Extension)  Level 2 (Red)    Row  Theraband;10 reps    Theraband Level (Shoulder Row)  Level 2 (Red)    Retraction  Theraband;10 reps    Theraband Level (Shoulder Retraction)  Level 2 (Red)      Shoulder Exercises: ROM/Strengthening   UBE (Upper Arm Bike)  Level 1 3' forward 3' reverse   pace: 4.5-5.0   Wall Wash  1'    Proximal Shoulder Strengthening, Supine  A/ROM 10X, no rest breaks    Proximal Shoulder Strengthening, Seated  A/ROM 10X, 2 rest breaks    Other ROM/Strengthening Exercises  PVC pipe slide 10X      Manual Therapy   Manual Therapy  Myofascial release    Manual therapy comments  manual therapy completed seperately than all other interventions this date of service    Myofascial  Release  myofascial release and manual stretching to right upper arm, scapular and shoulder region to decrease pain and restrictions and improve pain free mobility in right shoulder region               OT Short Term Goals - 08/05/18 1212      OT SHORT TERM GOAL #1   Title  Patient will be educated to decrease pain and improve pain free mobilty needed for daily tasks.     Time  6    Period  Weeks    Status  On-going      OT SHORT TERM GOAL #2   Title  Patient will increase A/ROM to [facility] and P/ROM to WNL in right shoulder for improved abilty to comb hair and fasten bra.     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     Time  6    Period  Weeks    Status  On-going      OT SHORT TERM GOAL #4   Title  Patient will decrease pain in right shoulder to 2/10 or better when sleeping on her right side at  night.     Time  6    Period  Weeks    Status  On-going      OT SHORT TERM GOAL #5   Title  Patient will decrease fascial restrictions in his right shoulder to minimal in order to improve independence with functional activities.     Time  6    Period  Weeks    Status  On-going               Plan - 08/25/18 1632    Clinical Impression Statement  A: Manual therapy completed to address fascial restrictions in upper arm and anterior deltoid today. Pt reports no pain during the day, soreness at night. Continued with A/ROM exercises and added scapular theraband retraction. Also continued with proximal shoulder strengthening. Pt requiring verbal cuing for form and technique, during scapular theraband tends to lean backwards and use body versus using arms.     Plan  P: manual therapy as needed. Add x to v arms and resume overhead lacing for improved activity tolerance required for functional tasks.        Patient will benefit from skilled therapeutic intervention in order to improve the following deficits and impairments:  Increased muscle spasms, Pain, Decreased range of motion, Decreased strength, Increased fascial restrictions, Impaired UE functional use  Visit Diagnosis: Other symptoms and signs involving the musculoskeletal system  Stiffness of right shoulder, not elsewhere classified  Acute pain of right shoulder    Problem List There are no active problems to display for this patient.  Guadelupe Sabin, OTR/L  [phone] 08/25/2018, 4:40 PM  [facility] [address] [place], [place], [address] Phone: [phone]   Fax:  [phone]  Name: Gabrielle Lowe MRN: [id number] Date of Birth: 11-09-1950

## 2018-08-31 ENCOUNTER — Ambulatory Visit (HOSPITAL_COMMUNITY): Payer: Medicare HMO | Admitting: Occupational Therapy

## 2018-08-31 ENCOUNTER — Encounter (HOSPITAL_COMMUNITY): Payer: Self-pay | Admitting: Occupational Therapy

## 2018-08-31 DIAGNOSIS — M25511 Pain in right shoulder: Secondary | ICD-10-CM

## 2018-08-31 DIAGNOSIS — R29898 Other symptoms and signs involving the musculoskeletal system: Secondary | ICD-10-CM

## 2018-08-31 DIAGNOSIS — M25611 Stiffness of right shoulder, not elsewhere classified: Secondary | ICD-10-CM

## 2018-08-31 NOTE — Therapy (Signed)
[place], [place], [address] Phone: [phone]   Fax:  [phone]  Occupational Therapy Treatment  Patient Details  Name: Gabrielle Lowe MRN: [id number] Date of Birth: 01-02-1951 Referring Provider (OT): Dr. Arther Abbott   Encounter Date: 08/31/2018  OT End of Session - 08/31/18 1601    Visit Number  7    Number of Visits  12    Date for OT Re-Evaluation  09/13/18    Authorization Type  [employer] medicare no auth required    OT Start Time  2355    OT Stop Time  1600    OT Time Calculation (min)  44 min    Activity Tolerance  Patient tolerated treatment well    Behavior During Therapy  [facility] for tasks assessed/performed       Past Medical History:  Diagnosis Date  . Arthritis   . Hyperlipemia   . Hypertension   . Obesity     Past Surgical History:  Procedure Laterality Date  . ABDOMINAL HYSTERECTOMY    . COLONOSCOPY WITH PROPOFOL N/A 03/15/2017   Procedure: COLONOSCOPY WITH PROPOFOL;  Surgeon: Manya Silvas, MD;  Location: [facility] ENDOSCOPY;  Service: Endoscopy;  Laterality: N/A;    There were no vitals filed for this visit.  Subjective Assessment - 08/31/18 1515    Subjective   S: I've been practicing my exercises.     Currently in Pain?  No/denies         [facility] OT Assessment - 08/31/18 1515      Assessment   Medical Diagnosis  Right Shoulder Bursitis, Arthritis, Tendonitis       Precautions   Precautions  None               OT Treatments/Exercises (OP) - 08/31/18 1518      Exercises   Exercises  Shoulder      Shoulder Exercises: Supine   Protraction  PROM;5 reps;AROM;12 reps    Horizontal ABduction  PROM;5 reps;AROM;12 reps    External Rotation  PROM;5 reps;AROM;12 reps    Internal Rotation  PROM;5 reps;AROM;12 reps    Flexion  PROM;5 reps;AROM;12 reps    ABduction  PROM;5 reps;AROM;12 reps      Shoulder Exercises: Seated   Protraction  AROM;12 reps    Horizontal  ABduction  AROM;12 reps    External Rotation  AROM;12 reps    Internal Rotation  AROM;12 reps    Flexion  AROM;12 reps    Abduction  AROM;12 reps      Shoulder Exercises: Standing   Extension  Theraband;10 reps    Theraband Level (Shoulder Extension)  Level 2 (Red)    Row  Theraband;10 reps    Theraband Level (Shoulder Row)  Level 2 (Red)    Retraction  Theraband;10 reps    Theraband Level (Shoulder Retraction)  Level 2 (Red)      Shoulder Exercises: ROM/Strengthening   UBE (Upper Arm Bike)  Level 1 2' forward 2' reverse   pace: 4.5-5.0   Wall Wash  1'    Over Head Lace  2' seated    X to V Arms  10X    Proximal Shoulder Strengthening, Supine  A/ROM 10X, no rest breaks    Proximal Shoulder Strengthening, Seated  A/ROM 10X, no rest breaks      Manual Therapy   Manual Therapy  Myofascial release    Manual therapy comments  manual therapy completed seperately than all other interventions  this date of service    Myofascial Release  myofascial release and manual stretching to right upper arm, scapular and shoulder region to decrease pain and restrictions and improve pain free mobility in right shoulder region               OT Short Term Goals - 08/05/18 1212      OT SHORT TERM GOAL #1   Title  Patient will be educated to decrease pain and improve pain free mobilty needed for daily tasks.     Time  6    Period  Weeks    Status  On-going      OT SHORT TERM GOAL #2   Title  Patient will increase A/ROM to [facility] and P/ROM to WNL in right shoulder for improved abilty to comb hair and fasten bra.     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     Time  6    Period  Weeks    Status  On-going      OT SHORT TERM GOAL #4   Title  Patient will decrease pain in right shoulder to 2/10 or better when sleeping on her right side  at night.     Time  6    Period  Weeks    Status  On-going      OT SHORT TERM GOAL #5   Title  Patient will decrease fascial restrictions in his right shoulder to minimal in order to improve independence with functional activities.     Time  6    Period  Weeks    Status  On-going               Plan - 08/31/18 1539    Clinical Impression Statement  A: Moderate muscle knot palpated at upper trapezius this session, manual therapy completed to address. Continued with A/ROM increasing repetitions to 12 in supine and sitting, pt with occasional compensatory trapezius activation, corrected with verbal cuing. Resumed overhead lacing at 2' and continued with UBE. Verbal cuing for form and technique.      Plan  P: manual therapy as needed. Update HEP for theraband exercises.        Patient will benefit from skilled therapeutic intervention in order to improve the following deficits and impairments:  Increased muscle spasms, Pain, Decreased range of motion, Decreased strength, Increased fascial restrictions, Impaired UE functional use  Visit Diagnosis: Other symptoms and signs involving the musculoskeletal system  Stiffness of right shoulder, not elsewhere classified  Acute pain of right shoulder    Problem List There are no active problems to display for this patient.   Gabrielle Lowe, OTR/L  [phone] 08/31/2018, 4:02 PM  [facility] [address] [place], [place], [address] Phone: [phone]   Fax:  [phone]  Name: Gabrielle Lowe MRN: [id number] Date of Birth: August 13, 1950

## 2018-09-02 ENCOUNTER — Ambulatory Visit (HOSPITAL_COMMUNITY): Payer: Medicare HMO | Admitting: Occupational Therapy

## 2018-09-02 ENCOUNTER — Encounter (HOSPITAL_COMMUNITY): Payer: Self-pay | Admitting: Occupational Therapy

## 2018-09-02 DIAGNOSIS — M25511 Pain in right shoulder: Secondary | ICD-10-CM | POA: Diagnosis not present

## 2018-09-02 DIAGNOSIS — M25611 Stiffness of right shoulder, not elsewhere classified: Secondary | ICD-10-CM

## 2018-09-02 DIAGNOSIS — R29898 Other symptoms and signs involving the musculoskeletal system: Secondary | ICD-10-CM

## 2018-09-02 NOTE — Patient Instructions (Signed)

## 2018-09-02 NOTE — Therapy (Signed)
[place], [place], [address] Phone: [phone]   Fax:  [phone]  Occupational Therapy Treatment  Patient Details  Name: Gabrielle Lowe MRN: [id number] Date of Birth: Jan 23, 1951 Referring Provider (OT): Dr. Arther Abbott   Encounter Date: 09/02/2018  OT End of Session - 09/02/18 1641    Visit Number  8    Number of Visits  12    Date for OT Re-Evaluation  09/13/18    Authorization Type  [place] medicare no auth required    OT Start Time  1558    OT Stop Time  1643    OT Time Calculation (min)  45 min    Activity Tolerance  Patient tolerated treatment well    Behavior During Therapy  [facility] for tasks assessed/performed       Past Medical History:  Diagnosis Date  . Arthritis   . Hyperlipemia   . Hypertension   . Obesity     Past Surgical History:  Procedure Laterality Date  . ABDOMINAL HYSTERECTOMY    . COLONOSCOPY WITH PROPOFOL N/A 03/15/2017   Procedure: COLONOSCOPY WITH PROPOFOL;  Surgeon: Manya Silvas, MD;  Location: [facility] ENDOSCOPY;  Service: Endoscopy;  Laterality: N/A;    There were no vitals filed for this visit.  Subjective Assessment - 09/02/18 1556    Subjective   S: I think I'm getting it better.     Currently in Pain?  No/denies         [facility] OT Assessment - 09/02/18 1556      Assessment   Medical Diagnosis  Right Shoulder Bursitis, Arthritis, Tendonitis       Precautions   Precautions  None               OT Treatments/Exercises (OP) - 09/02/18 1603      Exercises   Exercises  Shoulder      Shoulder Exercises: Supine   Protraction  PROM;5 reps;AROM;12 reps    Horizontal ABduction  PROM;5 reps;AROM;12 reps    External Rotation  PROM;5 reps;AROM;12 reps   abducted   Internal Rotation  PROM;5 reps;AROM;12 reps   abducted   Flexion  PROM;5 reps;AROM;12 reps    ABduction  PROM;5 reps;AROM;12 reps      Shoulder Exercises: Sidelying   External Rotation   AROM;12 reps    Internal Rotation  AROM;12 reps    Flexion  AROM;12 reps    ABduction  AROM;12 reps    Other Sidelying Exercises  protraction, A/ROM, 12X    Other Sidelying Exercises  horizontal abduction, A/ROM, 12X      Shoulder Exercises: Standing   Protraction  AROM;12 reps    Horizontal ABduction  AROM;12 reps    External Rotation  AROM;12 reps    Internal Rotation  AROM;12 reps    Flexion  AROM;12 reps    ABduction  AROM;12 reps    Extension  Theraband;10 reps    Theraband Level (Shoulder Extension)  Level 2 (Red)    Row  Theraband;10 reps    Theraband Level (Shoulder Row)  Level 2 (Red)    Retraction  Theraband;10 reps    Theraband Level (Shoulder Retraction)  Level 2 (Red)      Shoulder Exercises: ROM/Strengthening   UBE (Upper Arm Bike)  Level 2 3' forward 3' reverse   Pace: 4.5-5.5   Over Head Lace  2' standing    X to V Arms  10X    Proximal Shoulder Strengthening,  Supine  A/ROM 12X, no rest breaks    Proximal Shoulder Strengthening, Seated  A/ROM 12X, no rest breaks      Manual Therapy   Manual Therapy  Myofascial release    Manual therapy comments  manual therapy completed seperately than all other interventions this date of service    Myofascial Release  myofascial release and manual stretching to right upper arm, scapular and shoulder region to decrease pain and restrictions and improve pain free mobility in right shoulder region             OT Education - 09/02/18 1627    Education Details  red scapular theraband    Person(s) Educated  Patient    Methods  Explanation;Demonstration;Handout    Comprehension  Verbalized understanding;Returned demonstration       OT Short Term Goals - 08/05/18 1212      OT SHORT TERM GOAL #1   Title  Patient will be educated to decrease pain and improve pain free mobilty needed for daily tasks.     Time  6    Period  Weeks    Status  On-going      OT SHORT TERM GOAL #2   Title  Patient will increase A/ROM to [facility]  and P/ROM to WNL in right shoulder for improved abilty to comb hair and fasten bra.     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     Time  6    Period  Weeks    Status  On-going      OT SHORT TERM GOAL #4   Title  Patient will decrease pain in right shoulder to 2/10 or better when sleeping on her right side at night.     Time  6    Period  Weeks    Status  On-going      OT SHORT TERM GOAL #5   Title  Patient will decrease fascial restrictions in his right shoulder to minimal in order to improve independence with functional activities.     Time  6    Period  Weeks    Status  On-going               Plan - 09/02/18 1639    Clinical Impression Statement  A: No manual therapy today as pt with trace fascial restrictions in RUE. Continued with A/ROM adding sidelying A/ROM today, completing overhead lacing in standing today. Min/mod fatigue during session, no increased pain. Provided red theraband for HEP. Verbal cuing for form and technique.     Plan  P: Manual therapy prn. Add ball on wall to work on scapular stability       Patient will benefit from skilled therapeutic intervention in order to improve the following deficits and impairments:  Increased muscle spasms, Pain, Decreased range of motion, Decreased strength, Increased fascial restrictions, Impaired UE functional use  Visit Diagnosis: Other symptoms and signs involving the musculoskeletal system  Stiffness of right shoulder, not elsewhere classified  Acute pain of right shoulder    Problem List There are no active problems to display for this patient.  Guadelupe Sabin, OTR/L  [phone] 09/02/2018, 4:44 PM  [facility] [address] [place], [place], [address] Phone: [phone]  Fax:  [phone]  Name:  Gabrielle Lowe MRN: [id number] Date of Birth: 07-Dec-1950

## 2018-09-06 ENCOUNTER — Ambulatory Visit (HOSPITAL_COMMUNITY): Payer: Medicare HMO

## 2018-09-06 ENCOUNTER — Encounter (HOSPITAL_COMMUNITY): Payer: Self-pay

## 2018-09-06 DIAGNOSIS — M25511 Pain in right shoulder: Secondary | ICD-10-CM

## 2018-09-06 DIAGNOSIS — M25611 Stiffness of right shoulder, not elsewhere classified: Secondary | ICD-10-CM

## 2018-09-06 DIAGNOSIS — R29898 Other symptoms and signs involving the musculoskeletal system: Secondary | ICD-10-CM

## 2018-09-06 NOTE — Patient Instructions (Signed)
For the following exercises: Repeat 10 Times Hold 1 Second Complete 1 Set Perform 1 Times a Day   Standing Shoulder External Rotation   Starting position: Stand with elbows bent to 90 degrees and upright posture. Weights in hands with neutral wrist alignment (thumbs toward the ceiling) End Position: Pull shoulder blades back, and rotate weights out away from body. Elbow angle and wrist angle should not change. Return to starting position Repeat 10 Times Hold 1 Second Complete 1 Set Perform 1 Times a Day   FREE WEIGHT - BILATERAL ABDUCTION IN NEUTRAL - LATERAL RAISE  While holding a dumbbell in both hands and with your elbows straight, raise your arms up from your side with the palms facing downward. Lower and repeat.  Do not let your shoulder shrug upwards and do not go over shoulder level height.     STANDING- Eccentric horizontal abduction/pec stretch  This exercise is intended to stretch your chest and improve posture.  Begin with your elbows against your ribs with your hands pointed up toward the ceiling with hand weights (weight to be determined by therapist). Lift your hands straight up toward the ceiling. Now slowly open your arms until they rest upon the ground. You should feel stretching across your chest and the front of your shoulders. Once your hands touch the ground return to the starting position and repeat.   This should not cause pain or popping. If it does, do not go as far. If pain/popping continues in a smaller range, hold the exercise until you see your therapist.    Standing Shoulder External Rotation  Starting position: Stand with elbows bent to 90 degrees and upright posture. Weights in hands with neutral wrist alignment (thumbs toward the ceiling) End Position: Pull shoulder blades back, and rotate weights out away from body. Elbow angle and wrist angle should not change. Return to starting position

## 2018-09-06 NOTE — Therapy (Signed)
[facility] [address] [place], [place], [address] Phone: [phone]   Fax:  [phone]  Occupational Therapy Treatment Reassessment and discharge Patient Details  Name: Gabrielle Lowe MRN: [id number] Date of Birth: 12/13/1950 Referring Provider (OT): Dr. Arther Abbott   Encounter Date: 09/06/2018  OT End of Session - 09/06/18 1655    Visit Number  9    Number of Visits  12    Authorization Type  [place] medicare no auth required    OT Start Time  1602   reassessment and discharge   OT Stop Time  1636    OT Time Calculation (min)  34 min    Activity Tolerance  Patient tolerated treatment well    Behavior During Therapy  [facility] for tasks assessed/performed       Past Medical History:  Diagnosis Date  . Arthritis   . Hyperlipemia   . Hypertension   . Obesity     Past Surgical History:  Procedure Laterality Date  . ABDOMINAL HYSTERECTOMY    . COLONOSCOPY WITH PROPOFOL N/A 03/15/2017   Procedure: COLONOSCOPY WITH PROPOFOL;  Surgeon: Manya Silvas, MD;  Location: [facility] ENDOSCOPY;  Service: Endoscopy;  Laterality: N/A;    There were no vitals filed for this visit.  Subjective Assessment - 09/06/18 1648    Subjective   S: I was able to reach into the back seat of the car today.     Currently in Pain?  No/denies         [facility] OT Assessment - 09/06/18 1648      Assessment   Medical Diagnosis  Right Shoulder Bursitis, Arthritis, Tendonitis     Referring Provider (OT)  Dr. Arther Abbott      Precautions   Precautions  None      ROM / Strength   AROM / PROM / Strength  AROM;PROM;Strength      Palpation   Palpation comment  Trace fascial restrictions in the right upper arm and trapezius region.       AROM   Overall AROM Comments  Assessed seated. IR/er abducted. Measurements were taken with shoulder adducted previously.     AROM Assessment Site  Shoulder    Right/Left Shoulder  Right    Right Shoulder Flexion   160 Degrees   previous: 90   Right Shoulder ABduction  140 Degrees   previous: 65   Right Shoulder Internal Rotation  50 Degrees   previous: 90 with shoulder adducted. LUE: 60 abducted   Right Shoulder External Rotation  80 Degrees   previous: 60 with shoulder adducted     PROM   Overall PROM   Within functional limits for tasks performed    Overall PROM Comments  assessed in supine    PROM Assessment Site  Shoulder    Right/Left Shoulder  Right      Strength   Overall Strength Comments  Assessed seated. IR/er abducted. Assessed adducted previously.     Strength Assessment Site  Shoulder    Right/Left Shoulder  Right    Right Shoulder Flexion  4+/5   previous: 4-/5   Right Shoulder ABduction  4+/5   previous: 4-/5   Right Shoulder Internal Rotation  4+/5   previous: 4-/5   Right Shoulder External Rotation  4+/5   previous: 4-/5              OT Treatments/Exercises (OP) - 09/06/18 1651      Exercises   Exercises  Shoulder      Shoulder Exercises: Supine   Protraction  PROM;5 reps    Horizontal ABduction  PROM;5 reps    External Rotation  PROM;5 reps    Internal Rotation  PROM;5 reps    Flexion  PROM;5 reps    ABduction  PROM;5 reps      Shoulder Exercises: Standing   Protraction  Strengthening;10 reps    Protraction Weight (lbs)  3    Horizontal ABduction  Strengthening;10 reps    Horizontal ABduction Weight (lbs)  3    External Rotation  Strengthening;10 reps    External Rotation Weight (lbs)  3    Internal Rotation  Strengthening;10 reps    Internal Rotation Weight (lbs)  3    Flexion  Strengthening;10 reps    Shoulder Flexion Weight (lbs)  3    ABduction  Strengthening;10 reps    Shoulder ABduction Weight (lbs)  3      Manual Therapy   Manual Therapy  Myofascial release    Manual therapy comments  manual therapy completed seperately than all other interventions this date of service    Myofascial Release  myofascial release and manual stretching to  right upper arm, scapular and shoulder region to decrease pain and restrictions and improve pain free mobility in right shoulder region             OT Education - 09/06/18 1652    Education Details  reviewed progress in therapy. Updated HEP to include shoulder strengthening. Recommended using a 3-4 lbs hand weight first then progressing from there to her 5lbs.     Person(s) Educated  Patient    Methods  Explanation;Demonstration;Verbal cues;Handout    Comprehension  Returned demonstration;Verbalized understanding       OT Short Term Goals - 09/06/18 1655      OT SHORT TERM GOAL #1   Title  Patient will be educated to decrease pain and improve pain free mobilty needed for daily tasks.     Time  6    Period  Weeks    Status  Achieved      OT SHORT TERM GOAL #2   Title  Patient will increase A/ROM to [facility] and P/ROM to WNL in right shoulder for improved abilty to comb hair and fasten bra.     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     Time  6    Period  Weeks    Status  Achieved      OT SHORT TERM GOAL #4   Title  Patient will decrease pain in right shoulder to 2/10 or better when sleeping on her right side at night.     Time  6    Period  Weeks    Status  Achieved      OT SHORT TERM GOAL #5   Title  Patient will decrease fascial restrictions in his right shoulder to minimal in order to improve independence with functional activities.     Time  6    Period  Weeks    Status  Achieved               Plan - 09/06/18 1656    Clinical Impression Statement  A: Pt arrives to therapy with reports of  no deficits. Reassessment completed. Patient met all therapy goals. She reports that she has no pain, she is able to use her RUE for all daily tasks, she was able to reach into the back seat of her car, and her fascial  restrictions have decreased to trace amount. HEP was reviewed and updated for shoulder strengthening. Patient is in agreement with discharge.     Plan  P: D/C from OT services with HEP.     Consulted and Agree with Plan of Care  Patient       Patient will benefit from skilled therapeutic intervention in order to improve the following deficits and impairments:  Increased muscle spasms, Pain, Decreased range of motion, Decreased strength, Increased fascial restrictions, Impaired UE functional use  Visit Diagnosis: Other symptoms and signs involving the musculoskeletal system  Stiffness of right shoulder, not elsewhere classified  Acute pain of right shoulder    Problem List There are no active problems to display for this patient.   OCCUPATIONAL THERAPY DISCHARGE SUMMARY  Visits from Start of Care: 9  Current functional level related to goals / functional outcomes: See above   Remaining deficits: See above   Education / Equipment: See above Plan: Patient agrees to discharge.  Patient goals were met. Patient is being discharged due to meeting the stated rehab goals.  ?????          [name], OTR/L,CBIS  [phone] 09/06/2018, 4:58 PM  [place], [place], [address] Phone: [phone]   Fax:  [phone]  Name: VALARI TAYLOR MRN: [id number] Date of Birth: [birth year]

## 2018-09-08 ENCOUNTER — Ambulatory Visit (HOSPITAL_COMMUNITY): Payer: Medicare HMO | Admitting: Occupational Therapy

## 2018-09-13 ENCOUNTER — Encounter (HOSPITAL_COMMUNITY): Payer: Medicare HMO

## 2018-09-15 ENCOUNTER — Encounter (HOSPITAL_COMMUNITY): Payer: Medicare HMO | Admitting: Occupational Therapy

## 2018-09-20 ENCOUNTER — Encounter (HOSPITAL_COMMUNITY): Payer: Medicare HMO

## 2018-09-22 ENCOUNTER — Encounter (HOSPITAL_COMMUNITY): Payer: Medicare HMO | Admitting: Occupational Therapy

## 2018-10-04 ENCOUNTER — Encounter (HOSPITAL_COMMUNITY): Payer: Medicare HMO

## 2018-10-04 ENCOUNTER — Encounter (HOSPITAL_COMMUNITY): Payer: Medicare HMO | Admitting: Occupational Therapy

## 2018-10-10 ENCOUNTER — Encounter (HOSPITAL_COMMUNITY): Payer: Self-pay

## 2018-10-11 ENCOUNTER — Encounter (HOSPITAL_COMMUNITY): Payer: Medicare HMO

## 2018-10-11 ENCOUNTER — Encounter (HOSPITAL_COMMUNITY): Payer: Medicare HMO | Admitting: Occupational Therapy

## 2018-10-19 ENCOUNTER — Telehealth (HOSPITAL_COMMUNITY): Payer: Self-pay

## 2018-10-19 NOTE — Telephone Encounter (Signed)
Spoke to [facility] and they stated since they did not requ Pre-Cert until Feb 1308 - This patient did not need to get Pre-Cert Approval for visits. That is why my claims kept coming back denied. Spoke to [employer] [facility]#[id number]. NF 10/19/2018

## 2019-03-30 IMAGING — DX DG SHOULDER 2+V*R*
3 series · 3 of 3 positions shown · non-contrast
Comparison: None.

CLINICAL DATA: Pain.  Fall 3 weeks prior

EXAM:
RIGHT SHOULDER - 2+ VIEW

[shoulder grashey]
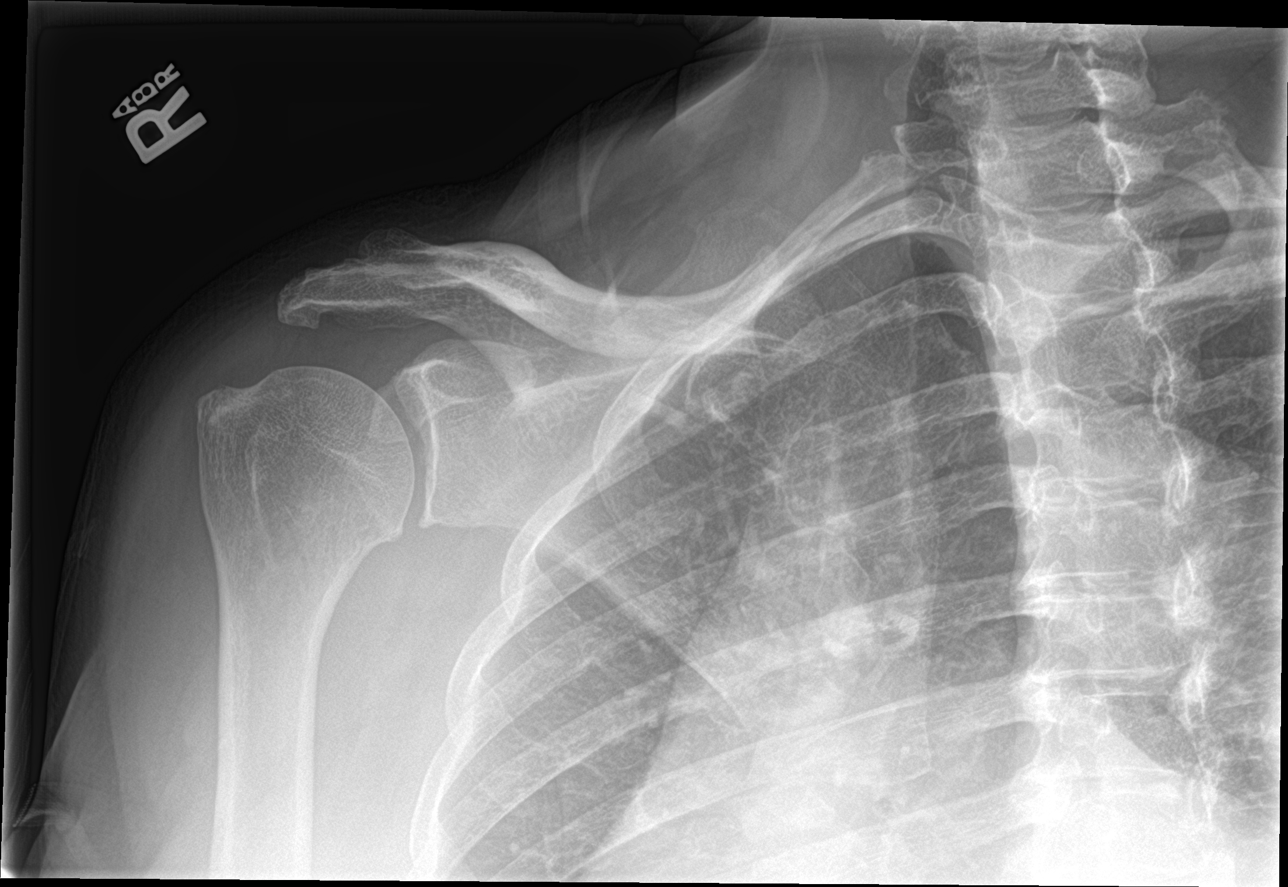

[shoulder y view]
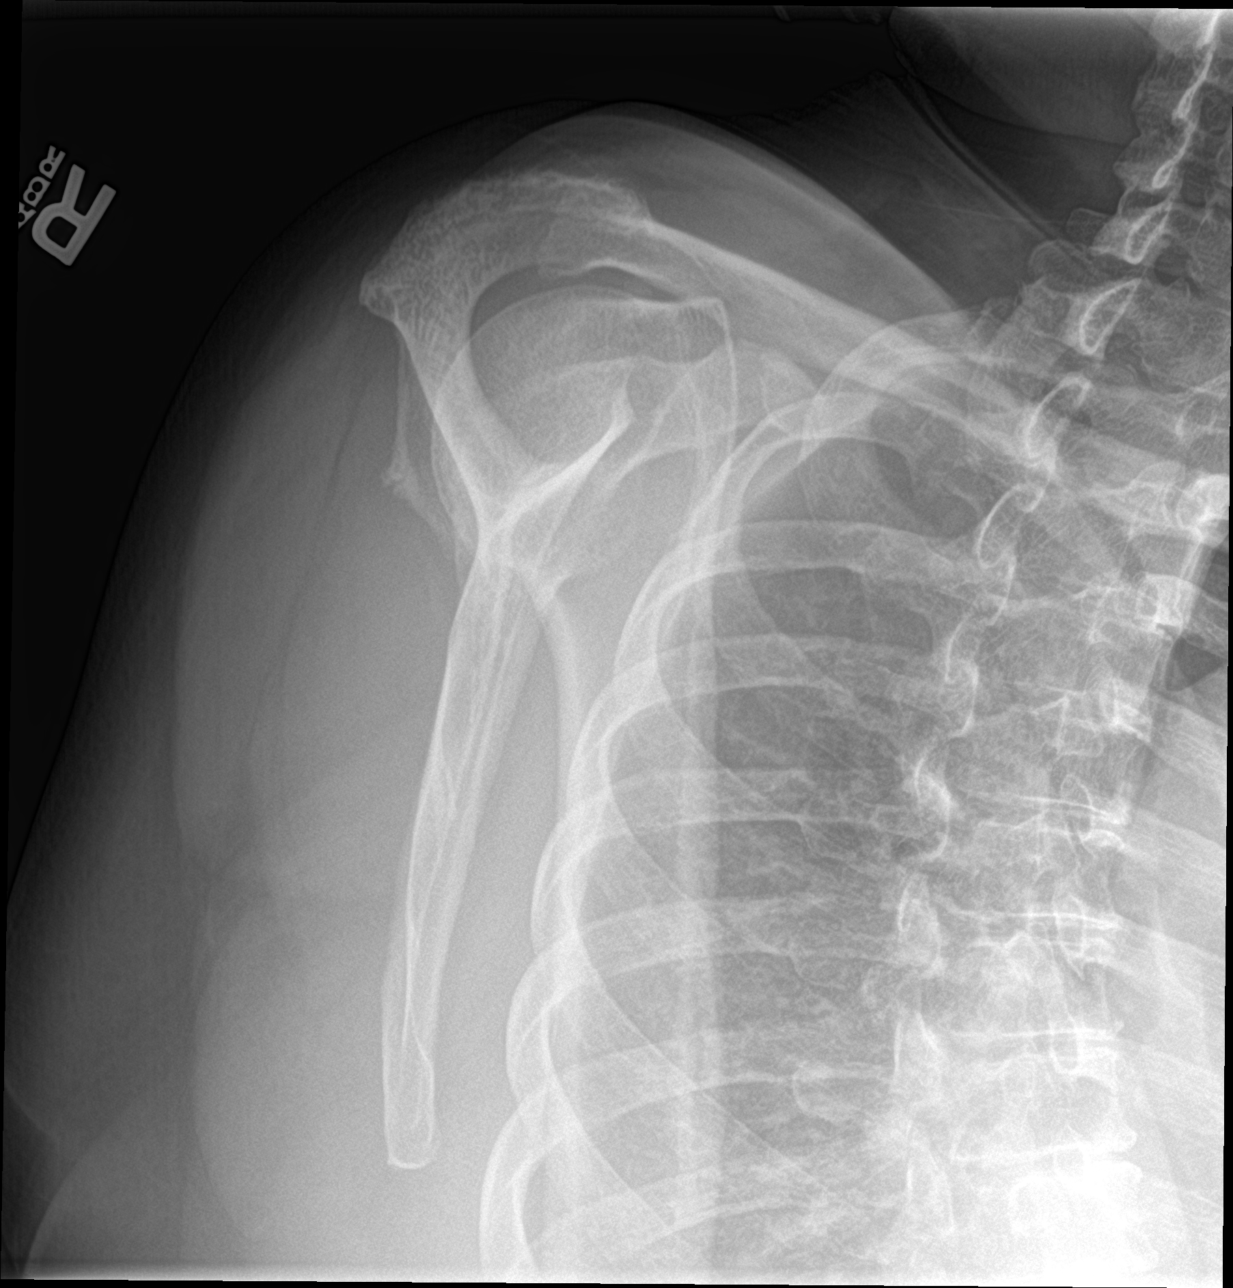

[shoulder axillary]
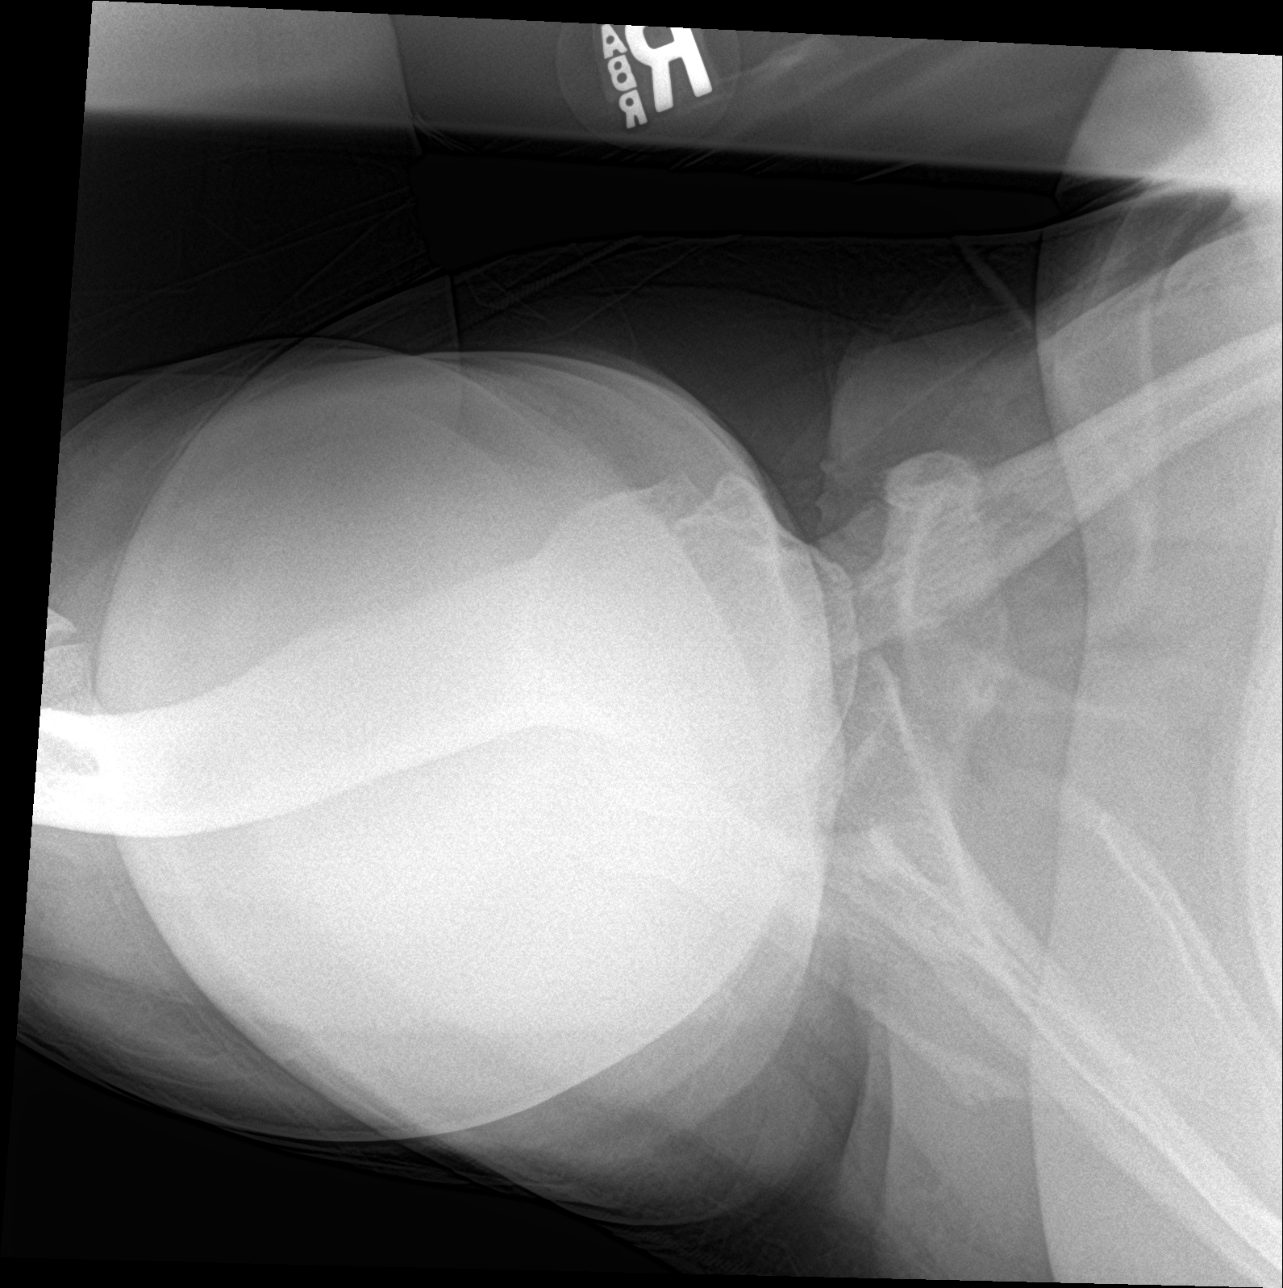

[3 of 3 positions shown; findings below may reference images not displayed]

FINDINGS: Oblique, Y scapular, and axillary images obtained. No acute
appearing fracture is evident. There is evidence of a prior
Hill-Sachs defect along the lateral humeral head. There is
osteoarthritic change, moderate in the acromioclavicular joint and
mild in the glenohumeral joint. No erosive change. Visualized right
lung is clear.
IMPRESSION: No acute fracture or dislocation. Hill-Sachs defect lateral humeral
head. Osteoarthritic change, most notably in the acromioclavicular
joint.

## 2019-06-01 ENCOUNTER — Other Ambulatory Visit: Payer: Self-pay

## 2019-06-01 DIAGNOSIS — Z20822 Contact with and (suspected) exposure to covid-19: Secondary | ICD-10-CM

## 2019-06-03 LAB — NOVEL CORONAVIRUS, NAA: SARS-CoV-2, NAA: NOT DETECTED

## 2019-06-08 ENCOUNTER — Telehealth: Payer: Self-pay | Admitting: General Practice

## 2019-06-08 NOTE — Telephone Encounter (Signed)
Patient informed of negative covid result. Patient verbalized understanding.   

## 2019-07-18 ENCOUNTER — Other Ambulatory Visit: Payer: Self-pay

## 2019-07-18 DIAGNOSIS — Z20822 Contact with and (suspected) exposure to covid-19: Secondary | ICD-10-CM

## 2019-07-20 ENCOUNTER — Telehealth: Payer: Self-pay | Admitting: Internal Medicine

## 2019-07-20 LAB — NOVEL CORONAVIRUS, NAA: SARS-CoV-2, NAA: NOT DETECTED

## 2019-07-20 NOTE — Telephone Encounter (Signed)
Patient informed of negative covid result. Patient verbalized understanding.

## 2021-01-09 ENCOUNTER — Telehealth: Payer: Self-pay | Admitting: Radiology

## 2021-01-09 ENCOUNTER — Other Ambulatory Visit: Payer: Self-pay

## 2021-01-09 ENCOUNTER — Encounter: Payer: Self-pay | Admitting: Orthopedic Surgery

## 2021-01-09 ENCOUNTER — Ambulatory Visit: Payer: Medicare Other | Admitting: Orthopedic Surgery

## 2021-01-09 ENCOUNTER — Ambulatory Visit: Payer: Medicare Other

## 2021-01-09 VITALS — BP 161/82 | HR 70 | Ht 62.0 in | Wt 245.1 lb

## 2021-01-09 DIAGNOSIS — Z6841 Body Mass Index (BMI) 40.0 and over, adult: Secondary | ICD-10-CM

## 2021-01-09 DIAGNOSIS — G8929 Other chronic pain: Secondary | ICD-10-CM

## 2021-01-09 DIAGNOSIS — M1711 Unilateral primary osteoarthritis, right knee: Secondary | ICD-10-CM | POA: Diagnosis not present

## 2021-01-09 MED ORDER — MELOXICAM 7.5 MG PO TABS: 7.5000 mg | ORAL_TABLET | Freq: Every day | ORAL | 5 refills | Status: AC

## 2021-01-09 NOTE — Telephone Encounter (Signed)
Patient wants to be considered for hyaluronic acid injections and I told her we would check with her insurance and let her know

## 2021-01-09 NOTE — Patient Instructions (Signed)

## 2021-01-09 NOTE — Progress Notes (Signed)
Follow up   Chief Complaint  Patient presents with  . Knee Pain    Both hurt, but the right is the worst.    History of oa rt knee   Lateral knee pain   Gabrielle Lowe complains of lateral greater than medial knee pain we saw her back in 2017 for the right knee and she had some injections    The patient meets the AMA guidelines for Morbid (severe) obesity with a BMI > 40.0 and I have recommended weight loss. BP (!) 161/82   Pulse 70   Ht 5\' 2"  (1.575 m)   Wt 245 lb 2 oz (111.2 kg)   BMI 44.83 kg/m  Physical Exam Constitutional:      General: She is not in acute distress.    Appearance: Normal appearance. She is obese. She is not toxic-appearing.  Skin:    General: Skin is warm and dry.  Neurological:     Mental Status: She is alert.     Sensory: No sensory deficit.     Motor: No weakness.     Gait: Gait abnormal.  Psychiatric:        Mood and Affect: Mood normal.        Behavior: Behavior normal.        Thought Content: Thought content normal.        Judgment: Judgment normal.    Tenderness is actually lateral although the x-rays show that it would more likely be medial  Her x-ray today shows medial compartment disease varus deformity peripheral osteophytes all 3 compartments  Inject right knee.  Site confirmed consent given 40 of Depo-Medrol 2 cc 1% lidocaine injected into the right knee via lateral approach  No complications sterile technique was used and ethyl chloride and alcohol were also used.  Plan Ck into hyaluronic acid   counseled on weight loss

## 2021-01-14 NOTE — Telephone Encounter (Signed)
Submitted online MySynvisc for BV. Bil knees, Synvisc.

## 2021-01-15 NOTE — Telephone Encounter (Signed)
Insurance covers admin at 100% after a $35 copay at each visit.  Synvisc covered at 80%, pt will owe 20% OOP.  No PA, buy and bill are ok.  Ready to schedule.

## 2021-01-15 NOTE — Telephone Encounter (Signed)
I left message for her to call back and schedule the appointments she will have 35 dollar copay plus owe 20% on the med charges   Can schedule/ bilateral knees synvisc  3 injections one week apart

## 2021-01-23 NOTE — Telephone Encounter (Signed)
Called back to patient, as no response as of 01/23/21; left message to call back regarding scheduling appointments.

## 2023-02-04 ENCOUNTER — Other Ambulatory Visit: Payer: Self-pay | Admitting: Physician Assistant

## 2023-02-04 DIAGNOSIS — Z1231 Encounter for screening mammogram for malignant neoplasm of breast: Secondary | ICD-10-CM

## 2023-02-05 ENCOUNTER — Ambulatory Visit
Admission: RE | Admit: 2023-02-05 | Discharge: 2023-02-05 | Disposition: A | Discharge status: Home / Self Care | Payer: Medicare PPO | Source: Ambulatory Visit | Attending: Physician Assistant | Admitting: Physician Assistant

## 2023-02-05 DIAGNOSIS — Z1231 Encounter for screening mammogram for malignant neoplasm of breast: Secondary | ICD-10-CM | POA: Diagnosis present

## 2023-02-10 ENCOUNTER — Inpatient Hospital Stay
Admission: RE | Admit: 2023-02-10 | Discharge: 2023-02-10 | Disposition: A | Discharge status: Home / Self Care | Payer: Self-pay | Source: Ambulatory Visit | Attending: Physician Assistant | Admitting: Physician Assistant

## 2023-02-10 ENCOUNTER — Other Ambulatory Visit: Payer: Self-pay | Admitting: *Deleted

## 2023-02-10 DIAGNOSIS — Z1231 Encounter for screening mammogram for malignant neoplasm of breast: Secondary | ICD-10-CM

## 2023-03-30 DIAGNOSIS — U071 COVID-19: Secondary | ICD-10-CM | POA: Diagnosis not present

## 2023-06-16 DIAGNOSIS — R202 Paresthesia of skin: Secondary | ICD-10-CM | POA: Diagnosis not present

## 2023-06-16 DIAGNOSIS — E119 Type 2 diabetes mellitus without complications: Secondary | ICD-10-CM | POA: Diagnosis not present

## 2023-06-16 DIAGNOSIS — I10 Essential (primary) hypertension: Secondary | ICD-10-CM | POA: Diagnosis not present

## 2023-06-16 DIAGNOSIS — E782 Mixed hyperlipidemia: Secondary | ICD-10-CM | POA: Diagnosis not present

## 2023-06-30 DIAGNOSIS — Z0100 Encounter for examination of eyes and vision without abnormal findings: Secondary | ICD-10-CM | POA: Diagnosis not present

## 2024-01-10 ENCOUNTER — Other Ambulatory Visit: Payer: Self-pay | Admitting: Physician Assistant

## 2024-01-10 DIAGNOSIS — Z1231 Encounter for screening mammogram for malignant neoplasm of breast: Secondary | ICD-10-CM

## 2024-02-09 ENCOUNTER — Ambulatory Visit
Admission: RE | Admit: 2024-02-09 | Discharge: 2024-02-09 | Disposition: A | Discharge status: Home / Self Care | Source: Ambulatory Visit | Attending: Physician Assistant | Admitting: Physician Assistant

## 2024-02-09 DIAGNOSIS — Z1231 Encounter for screening mammogram for malignant neoplasm of breast: Secondary | ICD-10-CM | POA: Diagnosis present
